# Patient Record
Sex: Male | Born: 1995 | Race: Black or African American | Hispanic: No | Marital: Single | Smoking: Current some day smoker
Health system: Southern US, Community
[De-identification: ages and names within clinical notes are randomized; demographics above are authoritative.]

## PROBLEM LIST (undated history)

## (undated) ENCOUNTER — Emergency Department (HOSPITAL_BASED_OUTPATIENT_CLINIC_OR_DEPARTMENT_OTHER): Payer: Medicaid Other | Source: Home / Self Care

## (undated) DIAGNOSIS — Z8489 Family history of other specified conditions: Secondary | ICD-10-CM

## (undated) DIAGNOSIS — W3400XA Accidental discharge from unspecified firearms or gun, initial encounter: Secondary | ICD-10-CM

## (undated) DIAGNOSIS — A64 Unspecified sexually transmitted disease: Secondary | ICD-10-CM

## (undated) DIAGNOSIS — Z8679 Personal history of other diseases of the circulatory system: Secondary | ICD-10-CM

## (undated) HISTORY — PX: WISDOM TOOTH EXTRACTION: SHX21

## (undated) HISTORY — PX: CARDIAC ELECTROPHYSIOLOGY MAPPING AND ABLATION: SHX1292

---
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
## 2013-02-07 ENCOUNTER — Emergency Department (HOSPITAL_BASED_OUTPATIENT_CLINIC_OR_DEPARTMENT_OTHER)
Admission: EM | Admit: 2013-02-07 | Discharge: 2013-02-08 | Disposition: A | Discharge status: Home / Self Care | Payer: Medicaid Other | Attending: Emergency Medicine | Admitting: Emergency Medicine

## 2013-02-07 ENCOUNTER — Encounter (HOSPITAL_BASED_OUTPATIENT_CLINIC_OR_DEPARTMENT_OTHER): Payer: Self-pay | Admitting: Emergency Medicine

## 2013-02-07 DIAGNOSIS — W2209XA Striking against other stationary object, initial encounter: Secondary | ICD-10-CM | POA: Insufficient documentation

## 2013-02-07 DIAGNOSIS — A549 Gonococcal infection, unspecified: Secondary | ICD-10-CM

## 2013-02-07 DIAGNOSIS — A5400 Gonococcal infection of lower genitourinary tract, unspecified: Secondary | ICD-10-CM | POA: Insufficient documentation

## 2013-02-07 DIAGNOSIS — F17200 Nicotine dependence, unspecified, uncomplicated: Secondary | ICD-10-CM | POA: Insufficient documentation

## 2013-02-07 DIAGNOSIS — S60221A Contusion of right hand, initial encounter: Secondary | ICD-10-CM

## 2013-02-07 DIAGNOSIS — Y9389 Activity, other specified: Secondary | ICD-10-CM | POA: Insufficient documentation

## 2013-02-07 DIAGNOSIS — Y9289 Other specified places as the place of occurrence of the external cause: Secondary | ICD-10-CM | POA: Insufficient documentation

## 2013-02-07 DIAGNOSIS — S60229A Contusion of unspecified hand, initial encounter: Secondary | ICD-10-CM | POA: Insufficient documentation

## 2013-02-07 NOTE — ED Notes (Signed)
Pt reports right hand pain x 4 days after punching wall also here to been for possible STD

## 2013-02-08 ENCOUNTER — Emergency Department (HOSPITAL_BASED_OUTPATIENT_CLINIC_OR_DEPARTMENT_OTHER): Payer: Medicaid Other

## 2013-02-08 LAB — GC/CHLAMYDIA PROBE AMP: GC Probe RNA: POSITIVE — AB

## 2013-02-08 MED ORDER — CEFTRIAXONE SODIUM 250 MG IJ SOLR
250.0000 mg | Freq: Once | INTRAMUSCULAR | Status: AC
  Administered 2013-02-08: 250 mg via INTRAMUSCULAR
  Filled 2013-02-08: qty 250

## 2013-02-08 MED ORDER — LIDOCAINE HCL (PF) 1 % IJ SOLN
INTRAMUSCULAR | Status: AC
  Administered 2013-02-08: 5 mL
  Filled 2013-02-08: qty 5

## 2013-02-08 MED ORDER — AZITHROMYCIN 1 G PO PACK
1.0000 g | PACK | Freq: Once | ORAL | Status: AC
  Administered 2013-02-08: 1 g via ORAL
  Filled 2013-02-08: qty 1

## 2013-02-08 NOTE — ED Provider Notes (Signed)
CSN: [id number]     Arrival date & time 02/07/13  2317 History   First MD Initiated Contact with Patient 25-Feb-2013 0056     Chief Complaint  Patient presents with  . Hand Injury   (Consider location/radiation/quality/duration/timing/severity/associated sxs/prior Treatment) HPI This is a 17 year old male who states he punched a wall 2 days ago. He is having pain and numbness over the right fifth MCP joint. There is no associated deformity or numbness of the finger distally. Pain is worse with movement or palpation. Pain is mild to moderate.  He is also here with a three-day history of green penile discharge and burning with urination. He denies abdominal pain, fever, chills, nausea, vomiting or diarrhea.  History reviewed. No pertinent past medical history. Past Surgical History  Procedure Laterality Date  . 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 No pertinent family history. History  Substance Use Topics  . Smoking status: Current Every Day Smoker -- 1.00 packs/day    Types: Cigarettes  . Smokeless tobacco: Never Used  . Alcohol Use: Yes    Review of Systems  All other systems reviewed and are negative.    Allergies  Review of patient's allergies indicates no known allergies.  Home Medications   Current Outpatient Rx  Name  Route  Sig  Dispense  Refill  . amoxicillin-clavulanate (AUGMENTIN) 875-125 MG per tablet   Oral   Take 1 tablet by mouth 2 (two) times daily. One po bid x 7 days   14 tablet   0    BP 116/77  Pulse 79  Temp(Src) 98.3 F (36.8 C) (Oral)  Resp 18  SpO2 100%  Physical Exam General: Well-developed, well-nourished male in no acute distress; appearance consistent with age of record HENT: normocephalic; atraumatic Eyes: Normal Neck: supple Heart: regular rate and rhythm Lungs: clear to auscultation bilaterally Abdomen: soft; nondistended; nontender; no masses or hepatosplenomegaly GU: Tanner  4 male, circumcised; green urethral discharge Extremities: No deformity; full range of motion; pulses normal; tenderness over the right fifth MCP joint with decreased sensation to light touch, right fifth finger distally neurovascularly intact Neurologic: Awake, alert and oriented; motor function intact in all extremities and symmetric; no facial droop Skin: Warm and dry Psychiatric: Flat affect    ED Course  Procedures (including critical care time)   MDM  Nursing notes and vitals signs, including pulse oximetry, reviewed.  Summary of this visit's results, reviewed by myself:  Imaging Studies: Dg Hand Complete Right  02-25-13   CLINICAL DATA:  Punched wall 4 days ago.  Pain.  EXAM: RIGHT HAND - COMPLETE 3+ VIEW  COMPARISON:  None.  FINDINGS: No acute fracture or dislocation.  No definite soft tissue swelling.  IMPRESSION: No acute osseous abnormality.   Electronically Signed   By: Jeronimo Greaves M.D.   On: Feb 25, 2013 02:37        Hanley Seamen, MD 2013/02/25 [phone]

## 2013-02-08 NOTE — ED Notes (Signed)
No adverse effects noted to IM injection.  

## 2013-02-09 ENCOUNTER — Telehealth (HOSPITAL_COMMUNITY): Payer: Self-pay | Admitting: Emergency Medicine

## 2013-02-09 NOTE — ED Notes (Signed)
+  Gonorrhea. Patient treated with Rocephin and Zithromax. [facility] faxed. 

## 2013-02-09 NOTE — ED Notes (Signed)
Patient has +Gonorrhea. °

## 2013-02-12 NOTE — ED Notes (Signed)
Unable to contact patient via phone. Sent letter. °

## 2013-03-14 ENCOUNTER — Encounter (HOSPITAL_BASED_OUTPATIENT_CLINIC_OR_DEPARTMENT_OTHER): Payer: Self-pay | Admitting: Emergency Medicine

## 2013-03-14 DIAGNOSIS — R05 Cough: Secondary | ICD-10-CM | POA: Insufficient documentation

## 2013-03-14 DIAGNOSIS — R509 Fever, unspecified: Secondary | ICD-10-CM | POA: Insufficient documentation

## 2013-03-14 DIAGNOSIS — R059 Cough, unspecified: Secondary | ICD-10-CM | POA: Insufficient documentation

## 2013-03-14 DIAGNOSIS — Z9889 Other specified postprocedural states: Secondary | ICD-10-CM | POA: Insufficient documentation

## 2013-03-14 DIAGNOSIS — F17200 Nicotine dependence, unspecified, uncomplicated: Secondary | ICD-10-CM | POA: Insufficient documentation

## 2013-03-14 DIAGNOSIS — J029 Acute pharyngitis, unspecified: Secondary | ICD-10-CM | POA: Insufficient documentation

## 2013-03-14 DIAGNOSIS — J3489 Other specified disorders of nose and nasal sinuses: Secondary | ICD-10-CM | POA: Insufficient documentation

## 2013-03-14 DIAGNOSIS — B9789 Other viral agents as the cause of diseases classified elsewhere: Secondary | ICD-10-CM | POA: Insufficient documentation

## 2013-03-14 NOTE — ED Notes (Signed)
Pt reports cough, sore throat, abdominal pain, shortness of breath, that started today. Reports ETOH consumption last night.

## 2013-03-14 NOTE — ED Provider Notes (Signed)
CSN: [id number]     Arrival date & time 03/14/13  2353 History  This chart was scribed for Allen Matar Smitty CordsK Kiira Brach-Rasch, MD by Allen Diaz, ED Scribe. This patient was seen in room MH09/MH09 and the patient's care was started 11:55 PM.    Chief Complaint  Patient presents with  . Cough  . Sore Throat    Patient is a 18 y.o. male presenting with URI. The history is provided by the patient. No language interpreter was used.  URI Presenting symptoms: congestion, cough, fever and sore throat   Presenting symptoms: no ear pain and no fatigue   Congestion:    Location:  Nasal   Interferes with sleep: no   Cough:    Cough characteristics:  Non-productive   Severity:  Mild   Onset quality:  Gradual   Timing:  Sporadic   Progression:  Unchanged   Chronicity:  New Fever:    Timing:  Intermittent   Progression:  Unchanged Sore throat:    Severity:  Moderate   Onset quality:  Gradual   Timing:  Constant   Progression:  Unchanged Severity:  Moderate Onset quality:  Gradual Timing:  Constant Progression:  Unchanged Chronicity:  New Relieved by:  Nothing Worsened by:  Nothing tried Ineffective treatments:  None tried Associated symptoms: no arthralgias and no neck pain   Risk factors: not elderly     HPI Comments: Allen Diaz is a 18 y.o. male who presents to the Emergency Department complaining of 1 day of gradual onset, gradually worsening, constant sore throat with associated fever, cough, congestion, and chest tightness. He has been taking ibuprofen with mild relief. He denies any other symptoms.   History reviewed. No pertinent past medical history. Past Surgical History  Procedure Laterality Date  . Cardiac valve surgery      as a child    History reviewed. No pertinent family history. History  Substance Use Topics  . Smoking status: Current Some Day Smoker -- 1.00 packs/day    Types: Cigarettes  . Smokeless tobacco: Never Used  . Alcohol Use: Yes     Comment: ocassion     Review of Systems  Constitutional: Positive for fever. Negative for fatigue.  HENT: Positive for congestion and sore throat. Negative for drooling, ear pain, trouble swallowing and voice change.   Respiratory: Positive for cough.   Cardiovascular: Negative for chest pain.  Musculoskeletal: Negative for arthralgias and neck pain.  All other systems reviewed and are negative.    Allergies  Review of patient's allergies indicates no known allergies.  Home Medications   Current Outpatient Rx  Name  Route  Sig  Dispense  Refill  . oxyCODONE-acetaminophen (PERCOCET/ROXICET) 5-325 MG per tablet   Oral   Take 1 tablet by mouth every 4 (four) hours as needed for pain.   15 tablet   0    BP 105/59  Pulse 112  Temp(Src) 101.6 F (38.7 C) (Oral)  Resp 20  Ht 5\' 11"  (1.803 m)  Wt 145 lb (65.772 kg)  BMI 20.23 kg/m2  SpO2 100% Physical Exam  Nursing note and vitals reviewed. Constitutional: He is oriented to person, place, and time. He appears well-developed and well-nourished. No distress.  HENT:  Head: Normocephalic and atraumatic.  Right Ear: External ear normal.  Left Ear: External ear normal.  Mouth/Throat: Uvula is midline, oropharynx is clear and moist and mucous membranes are normal. Mucous membranes are not dry. No oropharyngeal exudate, posterior oropharyngeal edema, posterior oropharyngeal erythema or tonsillar  abscesses.  Eyes: Pupils are equal, round, and reactive to light.  Neck: Normal range of motion. Neck supple.  Cardiovascular: Normal rate, regular rhythm, normal heart sounds and intact distal pulses.   Pulmonary/Chest: Effort normal and breath sounds normal. No respiratory distress. He has no wheezes. He has no rales. He exhibits no tenderness.  Lungs are clear to auscultation, anteriorly and posteriorly.   Abdominal: Soft. Bowel sounds are normal. He exhibits no distension. There is no tenderness. There is no rebound.  Lymphadenopathy:    He has no  cervical adenopathy.  Neurological: He is alert and oriented to person, place, and time.  Skin: Skin is warm and dry.  Psychiatric: He has a normal mood and affect.    ED Course  Procedures (including critical care time)  DIAGNOSTIC STUDIES: Oxygen Saturation is 100% on RA, normal by my interpretation.    COORDINATION OF CARE: 12:13 AM Discussed treatment plan with pt at bedside and pt agreed to plan.   Labs Review Labs Reviewed  RAPID STREP SCREEN   Imaging Review No results found.  EKG Interpretation   None       MDM  No diagnosis found. Tylenol alternating with ibuprofen, fluids and rest   I personally performed the services described in this documentation, which was scribed in my presence. The recorded information has been reviewed and is accurate.   Allen Awe, MD 03/15/13 [phone]

## 2013-03-15 ENCOUNTER — Emergency Department (HOSPITAL_BASED_OUTPATIENT_CLINIC_OR_DEPARTMENT_OTHER)
Admission: EM | Admit: 2013-03-15 | Discharge: 2013-03-15 | Disposition: A | Discharge status: Home / Self Care | Payer: Medicaid Other | Attending: Emergency Medicine | Admitting: Emergency Medicine

## 2013-03-15 DIAGNOSIS — B349 Viral infection, unspecified: Secondary | ICD-10-CM

## 2013-03-15 LAB — RAPID STREP SCREEN (MED CTR MEBANE ONLY): Streptococcus, Group A Screen (Direct): NEGATIVE

## 2013-03-15 MED ORDER — IBUPROFEN 600 MG PO TABS: 600.0000 mg | ORAL_TABLET | Freq: Four times a day (QID) | ORAL | Status: DC | PRN

## 2013-03-15 MED ORDER — IBUPROFEN 800 MG PO TABS
800.0000 mg | ORAL_TABLET | Freq: Once | ORAL | Status: AC
  Administered 2013-03-15: 800 mg via ORAL
  Filled 2013-03-15: qty 1

## 2013-03-17 LAB — CULTURE, GROUP A STREP

## 2013-08-31 HISTORY — PX: IM NAILING TIBIA: SUR734

## 2013-12-23 ENCOUNTER — Encounter (HOSPITAL_BASED_OUTPATIENT_CLINIC_OR_DEPARTMENT_OTHER): Payer: Self-pay | Admitting: Emergency Medicine

## 2013-12-23 ENCOUNTER — Emergency Department (HOSPITAL_BASED_OUTPATIENT_CLINIC_OR_DEPARTMENT_OTHER)
Admission: EM | Admit: 2013-12-23 | Discharge: 2013-12-23 | Disposition: A | Discharge status: Home / Self Care | Payer: Medicaid Other | Attending: Emergency Medicine | Admitting: Emergency Medicine

## 2013-12-23 DIAGNOSIS — Z720 Tobacco use: Secondary | ICD-10-CM | POA: Diagnosis not present

## 2013-12-23 DIAGNOSIS — R300 Dysuria: Secondary | ICD-10-CM | POA: Diagnosis present

## 2013-12-23 DIAGNOSIS — Z792 Long term (current) use of antibiotics: Secondary | ICD-10-CM | POA: Insufficient documentation

## 2013-12-23 DIAGNOSIS — Z9889 Other specified postprocedural states: Secondary | ICD-10-CM | POA: Insufficient documentation

## 2013-12-23 DIAGNOSIS — N342 Other urethritis: Secondary | ICD-10-CM | POA: Insufficient documentation

## 2013-12-23 LAB — URINALYSIS, ROUTINE W REFLEX MICROSCOPIC
BILIRUBIN URINE: NEGATIVE
Glucose, UA: NEGATIVE mg/dL
HGB URINE DIPSTICK: NEGATIVE
KETONES UR: NEGATIVE mg/dL
Nitrite: NEGATIVE
Protein, ur: NEGATIVE mg/dL
SPECIFIC GRAVITY, URINE: 1.008 (ref 1.005–1.030)
UROBILINOGEN UA: 0.2 mg/dL (ref 0.0–1.0)
pH: 7.5 (ref 5.0–8.0)

## 2013-12-23 LAB — URINE MICROSCOPIC-ADD ON

## 2013-12-23 MED ORDER — CEFTRIAXONE SODIUM 250 MG IJ SOLR
250.0000 mg | Freq: Once | INTRAMUSCULAR | Status: AC
  Administered 2013-12-23: 250 mg via INTRAMUSCULAR
  Filled 2013-12-23: qty 250

## 2013-12-23 MED ORDER — AZITHROMYCIN 250 MG PO TABS
2000.0000 mg | ORAL_TABLET | Freq: Once | ORAL | Status: AC
  Administered 2013-12-23: 2000 mg via ORAL
  Filled 2013-12-23: qty 8

## 2013-12-23 NOTE — ED Notes (Signed)
C/o dysuria x last week-penile d/c x 2 days

## 2013-12-23 NOTE — ED Notes (Signed)
C/o burning w urination and brown penile discharge x 2 days

## 2013-12-23 NOTE — Discharge Instructions (Signed)
Urethritis °Urethritis is an inflammation of the tube through which urine exits your bladder (urethra).  °CAUSES °Urethritis is often caused by an infection in your urethra. The infection can be viral, like herpes. The infection can also be bacterial, like gonorrhea. °RISK FACTORS °Risk factors of urethritis include: °· Having sex without using a condom. °· Having multiple sexual partners. °· Having poor hygiene. °SIGNS AND SYMPTOMS °Symptoms of urethritis are less noticeable in women than in men. These symptoms include: °· Burning feeling when you urinate (dysuria). °· Discharge from your urethra. °· Blood in your urine (hematuria). °· Urinating more than usual. °DIAGNOSIS  °To confirm a diagnosis of urethritis, your health care provider will do the following: °· Ask about your sexual history. °· Perform a physical exam. °· Have you provide a sample of your urine for lab testing. °· Use a cotton swab to gently collect a sample from your urethra for lab testing. °TREATMENT  °It is important to treat urethritis. Depending on the cause, untreated urethritis may lead to serious genital infections and possibly infertility. Urethritis caused by a bacterial infection is treated with antibiotic medicine. All sexual partners must be treated.  °HOME CARE INSTRUCTIONS °· Do not have sex until the test results are known and treatment is completed, even if your symptoms go away before you finish treatment. °· If you were prescribed an antibiotic, finish it all even if you start to feel better. °SEEK MEDICAL CARE IF:  °· Your symptoms are not improved in 3 days. °· Your symptoms are getting worse. °· You develop abdominal pain or pelvic pain (in women). °· You develop joint pain. °· You have a fever. °SEEK IMMEDIATE MEDICAL CARE IF:  °· You have severe pain in the belly, back, or side. °· You have repeated vomiting. °MAKE SURE YOU: °· Understand these instructions. °· Will watch your condition. °· Will get help right away if you  are not doing well or get worse. °Document Released: 08/21/2000 Document Revised: 07/12/2013 Document Reviewed: 10/26/2012 °[employer]® Patient Information ©2015 [employer]. This information is not intended to replace advice given to you by your health care provider. Make sure you discuss any questions you have with your health care provider. ° °

## 2013-12-23 NOTE — ED Provider Notes (Signed)
CSN: [id number]     Arrival date & time 12/23/13  1916 History  This chart was scribed for Allen Diaz L Khaleb Broz, MD by Bronson CurbJacqueline Melvin, ED Scribe. This patient was seen in room MH07/MH07 and the patient's care was started at 9:18 PM.      Chief Complaint  Patient presents with  . Dysuria    The history is provided by the patient. No language interpreter was used.    HPI Comments: Allen Diaz is a 18 y.o. male who presents to the Emergency Department complaining of dysuria that has been ongoing for the past week. There is associated brown penile discharge for the past 2 days. Patient has history of prior episodes in which he was diagnosed with gonorrhea. Since this diagnosis, he reports he has only had sexual intercourse, that is sometimes unprotected, with his girlfriend. He states that his girlfriend has not exhibited symptoms of an STD, nor has she been diagnosed with one recently. he denies fever or any other symptoms at this time. Patient has no history of significant health conditions.   History reviewed. No pertinent past medical history. Past Surgical History  Procedure Laterality Date  . 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 Leg surgery    . Hip surgery     No family history on file. History  Substance Use Topics  . Smoking status: Current Every Day Smoker -- 1.00 packs/day    Types: Cigarettes  . Smokeless tobacco: Never Used  . Alcohol Use: Yes    Review of Systems  Genitourinary: Positive for dysuria and discharge.  All other systems reviewed and are negative.     Allergies  Review of patient's allergies indicates no known allergies.  Home Medications   Prior to Admission medications   Medication Sig Start Date End Date Taking? Authorizing Provider  amoxicillin-clavulanate (AUGMENTIN) 875-125 MG per tablet Take 1 tablet by mouth 2 (two) times daily. One po bid x 7 days 08/08/12   Nada Boozerobyn M Hess, PA-C   BP 133/82  Pulse  72  Temp(Src) 98.2 F (36.8 C) (Oral)  Resp 16  Ht 5\' 11"  (1.803 m)  Wt 144 lb (65.318 kg)  BMI 20.09 kg/m2  SpO2 99% Physical Exam  Nursing note and vitals reviewed. Constitutional: He is oriented to person, place, and time. He appears well-developed and well-nourished. No distress.  HENT:  Head: Normocephalic and atraumatic.  Eyes: Pupils are equal, round, and reactive to light.  Neck: Normal range of motion.  Cardiovascular: Normal rate and intact distal pulses.   Pulmonary/Chest: No respiratory distress.  Abdominal: Normal appearance. He exhibits no distension.  Genitourinary: Testes normal. No penile tenderness. Discharge found.  Musculoskeletal: Normal range of motion.  Neurological: He is alert and oriented to person, place, and time. No cranial nerve deficit.  Skin: Skin is warm and dry. No rash noted.  Psychiatric: He has a normal mood and affect. His behavior is normal.    ED Course  Procedures (including critical care time)   Medications  cefTRIAXone (ROCEPHIN) injection 250 mg (250 mg Intramuscular Given 12/23/13 2136)  azithromycin (ZITHROMAX) tablet 2,000 mg (2,000 mg Oral Given 12/23/13 2136)    COORDINATION OF CARE: At 2126 Discussed treatment plan with patient which includes Rocephin. Patient agrees.  Labs Review Labs Reviewed  URINALYSIS, ROUTINE W REFLEX MICROSCOPIC - Abnormal; Notable for the following:    Leukocytes, UA MODERATE (*)    All other components  within normal limits  GC/CHLAMYDIA PROBE AMP  URINE MICROSCOPIC-ADD ON    Imaging Review No results found.    MDM   Final diagnoses:  Urethritis    I personally performed the services described in this documentation, which was scribed in my presence. The recorded information has been reviewed and considered.   Allen Diaz L [name], MD 12/30/13 2149

## 2013-12-24 LAB — GC/CHLAMYDIA PROBE AMP
CT Probe RNA: NEGATIVE
GC PROBE AMP APTIMA: NEGATIVE
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
## 2015-12-29 ENCOUNTER — Encounter (HOSPITAL_BASED_OUTPATIENT_CLINIC_OR_DEPARTMENT_OTHER): Payer: Self-pay | Admitting: Emergency Medicine

## 2017-01-20 ENCOUNTER — Encounter (HOSPITAL_BASED_OUTPATIENT_CLINIC_OR_DEPARTMENT_OTHER): Payer: Self-pay | Admitting: *Deleted

## 2017-01-20 ENCOUNTER — Emergency Department (HOSPITAL_BASED_OUTPATIENT_CLINIC_OR_DEPARTMENT_OTHER)
Admission: EM | Admit: 2017-01-20 | Discharge: 2017-01-20 | Disposition: A | Discharge status: Home / Self Care | Payer: Self-pay | Attending: Emergency Medicine | Admitting: Emergency Medicine

## 2017-01-20 DIAGNOSIS — A6002 Herpesviral infection of other male genital organs: Secondary | ICD-10-CM | POA: Insufficient documentation

## 2017-01-20 DIAGNOSIS — F17210 Nicotine dependence, cigarettes, uncomplicated: Secondary | ICD-10-CM | POA: Insufficient documentation

## 2017-01-20 MED ORDER — VALACYCLOVIR HCL 500 MG PO TABS
1000.0000 mg | ORAL_TABLET | Freq: Once | ORAL | Status: AC
Start: 2017-01-20 — End: 2017-01-20
  Administered 2017-01-20: 1000 mg via ORAL
  Filled 2017-01-20: qty 2

## 2017-01-20 MED ORDER — CEFTRIAXONE SODIUM 250 MG IJ SOLR
250.0000 mg | Freq: Once | INTRAMUSCULAR | Status: AC
  Administered 2017-01-20: 250 mg via INTRAMUSCULAR
  Filled 2017-01-20: qty 250

## 2017-01-20 MED ORDER — VALACYCLOVIR HCL 1 G PO TABS: 1000.0000 mg | ORAL_TABLET | Freq: Two times a day (BID) | ORAL | 0 refills | Status: AC

## 2017-01-20 MED ORDER — AZITHROMYCIN 250 MG PO TABS
1000.0000 mg | ORAL_TABLET | Freq: Once | ORAL | Status: AC
  Administered 2017-01-20: 1000 mg via ORAL
  Filled 2017-01-20: qty 4

## 2017-01-20 NOTE — Discharge Instructions (Signed)
You were seen today with concerns for STDs.  Your exam is consistent with genital herpes.  This is a virus that cannot be cured but can be treated.  Take Valtrex for 10 days.  Abstain from sexual activity until lesions are no longer present.  Always use protection.  Inform partners of diagnosis.

## 2017-01-20 NOTE — ED Triage Notes (Addendum)
Pt c/o "bumps" to groin area and also to the posterior aspect of penis for past 3 days.  Denies any new sexual partners. Denies any penile discharge. Denies any urinary symptoms. States area is painful. Denies any fevers. Denies any std history

## 2017-01-20 NOTE — ED Provider Notes (Signed)
[facility] EMERGENCY DEPARTMENT Provider Note   CSN: [id number] Arrival date & time: 01/20/17  0342     History   Chief Complaint Chief Complaint  Patient presents with  . STD check    HPI Allen Diaz is a 21 y.o. male.  HPI  This is a 21 year old male who presents with concerns for STDs.  Patient reports 2-3-day history of noticing "bumps on my penis."  States that at times they are painful.  He denies any new sexual partners.  However, he reports inconsistent condom use.  Partner without any known symptoms.  He denies any dysuria or penile discharge.  He does have a history of STDs.  Denies any fevers or systemic symptoms.  History reviewed. No pertinent past medical history.  There are no active problems to display for this patient.   Past Surgical History:  Procedure Laterality Date  . 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 CARDIAC VALVE SURGERY     as a child   . HIP SURGERY    . LEG SURGERY         Home Medications    Prior to Admission medications   Medication Sig Start Date End Date Taking? Authorizing Provider  amoxicillin-clavulanate (AUGMENTIN) 875-125 MG per tablet Take 1 tablet by mouth 2 (two) times daily. One po bid x 7 days 08/08/12   Hess, Nada Boozerobyn M, PA-C  ibuprofen (ADVIL,MOTRIN) 600 MG tablet Take 1 tablet (600 mg total) by mouth every 6 (six) hours as needed. 03/15/13   Palumbo, April, MD  oxyCODONE-acetaminophen (PERCOCET/ROXICET) 5-325 MG per tablet Take 1 tablet by mouth every 4 (four) hours as needed for pain. 11/11/12   Pricilla LovelessGoldston, Scott, MD  valACYclovir (VALTREX) 1000 MG tablet Take 1 tablet (1,000 mg total) 2 (two) times daily for 10 days by mouth. 01/20/17 01/30/17  Jamisen Duerson, Mayer Maskerourtney F, MD    Family History No family history on file.  Social History Social History   Tobacco Use  . Smoking status: Current Every Day Smoker    Packs/day: 1.00    Types: Cigarettes  . Smokeless tobacco:  Never Used  Substance Use Topics  . Alcohol use: Yes    Comment: ocassion  . Drug use: No     Allergies   Patient has no known allergies.   Review of Systems Review of Systems  Constitutional: Negative for fever.  Genitourinary: Negative for discharge, penile pain, penile swelling and testicular pain.       Penile lesions  All other systems reviewed and are negative.    Physical Exam Updated Vital Signs BP 127/74 (BP Location: Left Arm)   Pulse 65   Temp 98 F (36.7 C) (Oral)   Resp 16   SpO2 99%   Physical Exam  Constitutional: He is oriented to person, place, and time. He appears well-developed and well-nourished.  HENT:  Head: Normocephalic and atraumatic.  Cardiovascular: Normal rate and regular rhythm.  Pulmonary/Chest: Effort normal. No respiratory distress.  Genitourinary:  Genitourinary Comments: Circumcised penis, multiple vesicular lesions noted at the base of the penis with 1-2 ulcerations less than 5 mm in size, reactive bilateral lymphadenopathy noted  Neurological: He is alert and oriented to person, place, and time.  Skin: Skin is warm and dry.  Psychiatric: He has a normal mood and affect.  Nursing note and vitals reviewed.    ED Treatments / Results  Labs (all labs ordered are listed, but  only abnormal results are displayed) Labs Reviewed  GC/CHLAMYDIA PROBE AMP ([place]    EKG  EKG Interpretation None       Radiology No results found.  Procedures Procedures (including critical care time)  Medications Ordered in ED Medications  azithromycin (ZITHROMAX) tablet 1,000 mg (not administered)  cefTRIAXone (ROCEPHIN) injection 250 mg (not administered)  valACYclovir (VALTREX) tablet 1,000 mg (not administered)     Initial Impression / Assessment and Plan / ED Course  I have reviewed the triage vital signs and the nursing notes.  Pertinent labs & imaging results that were available during my care of the patient were  reviewed by me and considered in my medical decision making (see chart for details).     Patient presents with concerns for STDs.  Clinical exam is suspicious for genital herpes.  Patient was tested and treated for other STDs as well.  Have referred patient to the health department for definitive testing.  Valtrex prescribed for presumed primary HSV outbreak.  Recommended safe sex practices and abstaining from sexual activity until lesions are clear.  Patient stated understanding.  After history, exam, and medical workup I feel the patient has been appropriately medically screened and is safe for discharge home. Pertinent diagnoses were discussed with the patient. Patient was given return precautions.   Final Clinical Impressions(s) / ED Diagnoses   Final diagnoses:  Herpes genitalis in men    ED Discharge Orders        Ordered    valACYclovir (VALTREX) 1000 MG tablet  2 times daily     01/20/17 0409       Lucifer Soja, Mayer Maskerourtney F, MD 01/20/17 [phone]

## 2017-01-20 NOTE — ED Notes (Signed)
Extensive STD education provided at d/c. Voiced understanding

## 2017-01-21 LAB — GC/CHLAMYDIA PROBE AMP (~~LOC~~) NOT AT ARMC
Chlamydia: POSITIVE — AB
NEISSERIA GONORRHEA: NEGATIVE

## 2017-01-25 ENCOUNTER — Emergency Department (HOSPITAL_BASED_OUTPATIENT_CLINIC_OR_DEPARTMENT_OTHER): Payer: Self-pay

## 2017-01-25 ENCOUNTER — Other Ambulatory Visit: Payer: Self-pay

## 2017-01-25 ENCOUNTER — Emergency Department (HOSPITAL_BASED_OUTPATIENT_CLINIC_OR_DEPARTMENT_OTHER)
Admission: EM | Admit: 2017-01-25 | Discharge: 2017-01-25 | Disposition: A | Discharge status: Home / Self Care | Payer: Self-pay | Attending: Emergency Medicine | Admitting: Emergency Medicine

## 2017-01-25 ENCOUNTER — Encounter (HOSPITAL_BASED_OUTPATIENT_CLINIC_OR_DEPARTMENT_OTHER): Payer: Self-pay | Admitting: *Deleted

## 2017-01-25 DIAGNOSIS — Z5321 Procedure and treatment not carried out due to patient leaving prior to being seen by health care provider: Secondary | ICD-10-CM | POA: Insufficient documentation

## 2017-01-25 NOTE — ED Triage Notes (Signed)
Call x 3, no answer.

## 2017-01-25 NOTE — ED Triage Notes (Signed)
Called x 2. Pt not located in either lobby. Did not inform staff before leaving 

## 2017-01-25 NOTE — ED Triage Notes (Signed)
Called for vital sign recheck, no answer 
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
## 2017-01-30 ENCOUNTER — Other Ambulatory Visit: Payer: Self-pay

## 2017-01-30 ENCOUNTER — Emergency Department (HOSPITAL_BASED_OUTPATIENT_CLINIC_OR_DEPARTMENT_OTHER)
Admission: EM | Admit: 2017-01-30 | Discharge: 2017-01-30 | Disposition: A | Discharge status: Home / Self Care | Payer: Self-pay | Attending: Physician Assistant | Admitting: Physician Assistant

## 2017-01-30 ENCOUNTER — Encounter (HOSPITAL_BASED_OUTPATIENT_CLINIC_OR_DEPARTMENT_OTHER): Payer: Self-pay

## 2017-01-30 DIAGNOSIS — F17210 Nicotine dependence, cigarettes, uncomplicated: Secondary | ICD-10-CM | POA: Insufficient documentation

## 2017-01-30 DIAGNOSIS — Z23 Encounter for immunization: Secondary | ICD-10-CM | POA: Insufficient documentation

## 2017-01-30 DIAGNOSIS — M25562 Pain in left knee: Secondary | ICD-10-CM | POA: Insufficient documentation

## 2017-01-30 DIAGNOSIS — R590 Localized enlarged lymph nodes: Secondary | ICD-10-CM | POA: Insufficient documentation

## 2017-01-30 DIAGNOSIS — Z760 Encounter for issue of repeat prescription: Secondary | ICD-10-CM | POA: Insufficient documentation

## 2017-01-30 HISTORY — DX: Unspecified sexually transmitted disease: A64

## 2017-01-30 MED ORDER — ACYCLOVIR 400 MG PO TABS
400.0000 mg | ORAL_TABLET | Freq: Four times a day (QID) | ORAL | 0 refills | Status: DC
Start: 2017-01-30 — End: 2017-03-19

## 2017-01-30 MED ORDER — TETANUS-DIPHTH-ACELL PERTUSSIS 5-2.5-18.5 LF-MCG/0.5 IM SUSP
0.5000 mL | Freq: Once | INTRAMUSCULAR | Status: AC
  Administered 2017-01-30: 0.5 mL via INTRAMUSCULAR
  Filled 2017-01-30: qty 0.5

## 2017-01-30 NOTE — ED Notes (Signed)
ED Provider at bedside. 
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
## 2017-02-06 ENCOUNTER — Encounter (HOSPITAL_BASED_OUTPATIENT_CLINIC_OR_DEPARTMENT_OTHER): Payer: Self-pay

## 2017-02-06 ENCOUNTER — Emergency Department (HOSPITAL_BASED_OUTPATIENT_CLINIC_OR_DEPARTMENT_OTHER)
Admission: EM | Admit: 2017-02-06 | Discharge: 2017-02-06 | Disposition: A | Discharge status: Home / Self Care | Payer: Self-pay | Attending: Emergency Medicine | Admitting: Emergency Medicine

## 2017-02-06 ENCOUNTER — Other Ambulatory Visit: Payer: Self-pay

## 2017-02-06 DIAGNOSIS — J020 Streptococcal pharyngitis: Secondary | ICD-10-CM | POA: Insufficient documentation

## 2017-02-06 DIAGNOSIS — F17210 Nicotine dependence, cigarettes, uncomplicated: Secondary | ICD-10-CM | POA: Insufficient documentation

## 2017-02-06 LAB — RAPID STREP SCREEN (MED CTR MEBANE ONLY): STREPTOCOCCUS, GROUP A SCREEN (DIRECT): POSITIVE — AB

## 2017-02-06 MED ORDER — IBUPROFEN 800 MG PO TABS
800.0000 mg | ORAL_TABLET | Freq: Once | ORAL | Status: AC
  Administered 2017-02-06: 800 mg via ORAL
  Filled 2017-02-06: qty 1

## 2017-02-06 MED ORDER — PENICILLIN G BENZATHINE 1200000 UNIT/2ML IM SUSP
1.2000 10*6.[IU] | Freq: Once | INTRAMUSCULAR | Status: AC
  Administered 2017-02-06: 1.2 10*6.[IU] via INTRAMUSCULAR
  Filled 2017-02-06: qty 2

## 2017-02-06 NOTE — ED Notes (Signed)
Had visitor step out and counseled pt on his multiple treatments for STDs and the importance getting further testing at the health department.  He is smiling and does not seem to grasp that it is important for him to get further testing and use condoms every time that he decides to engage in intercourse since he can still spread the herpes virus without an open sore.

## 2017-02-06 NOTE — ED Triage Notes (Signed)
Pt states he has had a sore throat for three days, no fever, has not taken anything, is on phone face-timing with someone when nurse walks into room

## 2017-02-06 NOTE — ED Notes (Signed)
Pt verbalizes understanding of d/c instructions and denies any further needs at this time. 

## 2017-02-06 NOTE — ED Notes (Addendum)
During visit on 11/22, pt requested another prescription for acyclovir as he did not get it filled from the 11/12 visit. Incidentally, pt still has filled the acyclovir and states that he did not take it.

## 2017-02-06 NOTE — ED Provider Notes (Addendum)
TIME SEEN: 3:23 AM  CHIEF COMPLAINT: Sore throat  HPI: Patient is a 21 year old male who presents to the emergency department with complaints of 3 days of sore throat.  No fevers, cough, vomiting or diarrhea.  No sick contacts.  ROS: See HPI Constitutional: no fever  Eyes: no drainage  ENT: no runny nose   Cardiovascular:  no chest pain  Resp: no SOB  GI: no vomiting GU: no dysuria Integumentary: no rash  Allergy: no hives  Musculoskeletal: no leg swelling  Neurological: no slurred speech ROS otherwise negative  PAST MEDICAL HISTORY/PAST SURGICAL HISTORY:  Past Medical History:  Diagnosis Date  . Chlamydia   . Genital herpes   . Gonorrhea   . STD (male)     MEDICATIONS:  Prior to Admission medications   Medication Sig Start Date End Date Taking? Authorizing Provider  acyclovir (ZOVIRAX) 400 MG tablet Take 1 tablet (400 mg total) by mouth 4 (four) times daily. 01/30/17   Mackuen, Cindee Saltourteney Lyn, MD    ALLERGIES:  No Known Allergies  SOCIAL HISTORY:  Social History   Tobacco Use  . Smoking status: Current Every Day Smoker    Packs/day: 1.00    Types: Cigarettes  . Smokeless tobacco: Never Used  Substance Use Topics  . Alcohol use: Yes    Comment: occ    FAMILY HISTORY: No family history on file.  EXAM: BP 129/75 (BP Location: Right Arm)   Pulse 78   Temp 98.9 F (37.2 C) (Oral)   Resp 16   Ht 5\' 11"  (1.803 m)   Wt 67.3 kg (148 lb 5.9 oz)   SpO2 100%   BMI 20.69 kg/m  CONSTITUTIONAL: Alert and oriented and responds appropriately to questions. Well-appearing; well-nourished, smiling and laughing, afebrile and nontoxic HEAD: Normocephalic EYES: Conjunctivae clear, pupils appear equal, EOMI ENT: normal nose; moist mucous membranes; patient has posterior pharyngeal erythema with bilateral tonsillar hypertrophy without significant exudate, no uvular deviation, no unilateral swelling, no trismus or drooling, no muffled voice, normal phonation, no stridor, no  dental caries present, no drainable dental abscess noted, no Ludwig's angina, tongue sits flat in the bottom of the mouth, no angioedema, no facial erythema or warmth, no facial swelling; no pain with movement of the neck.  No herpetic lesions seen in the mouth or oropharynx. NECK: Supple, no meningismus, no nuchal rigidity, no LAD  CARD: RRR; S1 and S2 appreciated; no murmurs, no clicks, no rubs, no gallops RESP: Normal chest excursion without splinting or tachypnea; breath sounds clear and equal bilaterally; no wheezes, no rhonchi, no rales, no hypoxia or respiratory distress, speaking full sentences ABD/GI: Normal bowel sounds; non-distended; soft, non-tender, no rebound, no guarding, no peritoneal signs, no hepatosplenomegaly BACK:  The back appears normal and is non-tender to palpation, there is no CVA tenderness EXT: Normal ROM in all joints; non-tender to palpation; no edema; normal capillary refill; no cyanosis, no calf tenderness or swelling    SKIN: Normal color for age and race; warm; no rash NEURO: Moves all extremities equally PSYCH: The patient's mood and manner are appropriate. Grooming and personal hygiene are appropriate.  MEDICAL DECISION MAKING: Patient here with strep pharyngitis.  He has opted for IM penicillin injection in the ED.  Recommended alternating Tylenol and Motrin for pain at home, warm salt water gargles, over-the-counter Chloraseptic spray and lozenges for pain control.  No sign of peritonsillar abscess, deep space neck infection, pneumonia, meningitis based on exam.  He is in no distress.  Smiling and  laughing.  Afebrile and nontoxic-appearing.  At this time, I do not feel there is any life-threatening condition present. I have reviewed and discussed all results (EKG, imaging, lab, urine as appropriate) and exam findings with patient/family. I have reviewed nursing notes and appropriate previous records.  I feel the patient is safe to be discharged home without further  emergent workup and can continue workup as an outpatient as needed. Discussed usual and customary return precautions. Patient/family verbalize understanding and are comfortable with this plan.  Outpatient follow-up has been provided if needed. All questions have been answered.      Eola Waldrep, Layla MawKristen N, DO 02/06/17 0325    Autumm Hattery, Layla MawKristen N, DO 02/06/17 [phone]

## 2017-02-06 NOTE — ED Notes (Signed)
ED Provider at bedside. 

## 2017-02-06 NOTE — Discharge Instructions (Signed)
You may alternate Tylenol 1000 mg every 6 hours as needed for pain and Ibuprofen 800 mg every 8 hours as needed for pain.  Please take Ibuprofen with food. ° ° ° °To find a primary care or specialty doctor please call [phone] or [phone] to access "[facility] Find a Doctor Service." ° °You may also go on the [facility] website at [email or web address] ° °There are also multiple [facility], [facility], [facility] and [facility] practices throughout the [place] that are frequently accepting new patients. You may find a clinic that is close to your home and contact them. ° °[facility] -  °[address] °[place] [address] °[phone] ° ° °[facility] -  °[address] °[place] [address] °[phone] ° ° °[facility] - °[address]  °[place] [address] °[phone] ° ° °

## 2017-03-19 ENCOUNTER — Other Ambulatory Visit: Payer: Self-pay

## 2017-03-19 ENCOUNTER — Encounter (HOSPITAL_BASED_OUTPATIENT_CLINIC_OR_DEPARTMENT_OTHER): Payer: Self-pay | Admitting: Emergency Medicine

## 2017-03-19 ENCOUNTER — Emergency Department (HOSPITAL_BASED_OUTPATIENT_CLINIC_OR_DEPARTMENT_OTHER)
Admission: EM | Admit: 2017-03-19 | Discharge: 2017-03-19 | Disposition: A | Discharge status: Home / Self Care | Payer: Self-pay | Attending: Emergency Medicine | Admitting: Emergency Medicine

## 2017-03-19 DIAGNOSIS — F17210 Nicotine dependence, cigarettes, uncomplicated: Secondary | ICD-10-CM | POA: Insufficient documentation

## 2017-03-19 DIAGNOSIS — J069 Acute upper respiratory infection, unspecified: Secondary | ICD-10-CM | POA: Insufficient documentation

## 2017-03-19 DIAGNOSIS — Z79899 Other long term (current) drug therapy: Secondary | ICD-10-CM | POA: Insufficient documentation

## 2017-03-19 DIAGNOSIS — B9789 Other viral agents as the cause of diseases classified elsewhere: Secondary | ICD-10-CM | POA: Insufficient documentation

## 2017-03-19 LAB — RAPID STREP SCREEN (MED CTR MEBANE ONLY): STREPTOCOCCUS, GROUP A SCREEN (DIRECT): NEGATIVE

## 2017-03-19 MED ORDER — DEXAMETHASONE 6 MG PO TABS
10.0000 mg | ORAL_TABLET | Freq: Once | ORAL | Status: AC
  Administered 2017-03-19: 10 mg via ORAL
  Filled 2017-03-19: qty 1

## 2017-03-19 NOTE — Discharge Instructions (Signed)
Drink plenty of fluids. Take acetaminophen or ibuprofen as needed for pain or fever. Stop smoking.  Once you are over this infection, please get the influenza vaccination (flu shot).

## 2017-03-19 NOTE — ED Provider Notes (Signed)
[facility] EMERGENCY DEPARTMENT Provider Note   CSN: [id number] Arrival date & time: 03/19/17  0428     History   Chief Complaint No chief complaint on file.   HPI Gilmore Gibson-Harris is a 22 y.o. male.  The history is provided by the patient.  He complains of a 3-4-day history of sore throat, nasal congestion, nonproductive cough.  He denies fever, chills, sweats.  He has had some mild arthralgias.  He denies any sick contacts.  He has not done anything to treat his symptoms.  He has not had the influenza immunization this season.  He is a cigarette smoker.  Past Medical History:  Diagnosis Date  . Chlamydia   . Genital herpes   . Gonorrhea   . STD (male)     There are no active problems to display for this patient.   Past Surgical History:  Procedure Laterality Date  . 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 CARDIAC VALVE SURGERY     as a child   . HIP SURGERY    . LEG SURGERY         Home Medications    Prior to Admission medications   Medication Sig Start Date End Date Taking? Authorizing Provider  acyclovir (ZOVIRAX) 400 MG tablet Take 1 tablet (400 mg total) by mouth 4 (four) times daily. 01/30/17   Mackuen, Cindee Salt, MD    Family History No family history on file.  Social History Social History   Tobacco Use  . Smoking status: Current Every Day Smoker    Packs/day: 1.00    Types: Cigarettes  . Smokeless tobacco: Never Used  Substance Use Topics  . Alcohol use: Yes    Comment: occ  . Drug use: No     Allergies   Patient has no known allergies.   Review of Systems Review of Systems  All other systems reviewed and are negative.    Physical Exam Updated Vital Signs BP 128/86 (BP Location: Left Arm)   Pulse 80   Temp 99 F (37.2 C) (Oral)   Resp 18   Ht 5\' 11"  (1.803 m)   Wt 67.6 kg (149 lb 1.6 oz)   SpO2 100%   BMI 20.80 kg/m   Physical Exam  Nursing note and vitals  reviewed.  22 year old male, resting comfortably and in no acute distress. Vital signs are normal. Oxygen saturation is 100%, which is normal. Head is normocephalic and atraumatic. PERRLA, EOMI. Oropharynx shows moderate tonsillar erythema and hypertrophy with faint exudate present bilaterally.  No pooling of secretions, no stridor, normal phonation. Neck is nontender and supple without adenopathy or JVD. Back is nontender and there is no CVA tenderness. Lungs are clear without rales, wheezes, or rhonchi. Chest is nontender. Heart has regular rate and rhythm without murmur. Abdomen is soft, flat, nontender without masses or hepatosplenomegaly and peristalsis is normoactive. Extremities have no cyanosis or edema, full range of motion is present. Skin is warm and dry without rash. Neurologic: Mental status is normal, cranial nerves are intact, there are no motor or sensory deficits.  ED Treatments / Results  Labs (all labs ordered are listed, but only abnormal results are displayed) Labs Reviewed  RAPID STREP SCREEN (NOT AT [facility])  CULTURE, GROUP A STREP [facility])    Procedures Procedures (including critical care time)  Medications Ordered in ED Medications - No data to display   Initial Impression /  Assessment and Plan / ED Course  I have reviewed the triage vital signs and the nursing notes.  Pertinent lab results that were available during my care of the patient were reviewed by me and considered in my medical decision making (see chart for details).  Upper respiratory infection, possible influenza.  He is outside the window for treatment of influenza, so no indication for testing.  Will send strep screen to rule out streptococcal pharyngitis.  Review of old records shows he was in the ED 6 weeks ago for streptococcal pharyngitis.  Strep screen is negative, culture pending.  He is discharged with instructions to use over-the-counter medications as needed for his URI, sore throat,  cough.  Advised to stop smoking, and advised to get the influenza immunization once he is recovered from current infection.  Final Clinical Impressions(s) / ED Diagnoses   Final diagnoses:  Viral URI with cough    ED Discharge Orders    None       Dione BoozeGlick, Cainen Burnham, MD 03/19/17 [phone]

## 2017-03-19 NOTE — ED Triage Notes (Signed)
Pt c/o cough, congestion sore throat x 3-4 days.

## 2017-03-21 LAB — CULTURE, GROUP A STREP (THRC)

## 2017-04-07 ENCOUNTER — Encounter (HOSPITAL_BASED_OUTPATIENT_CLINIC_OR_DEPARTMENT_OTHER): Payer: Self-pay | Admitting: *Deleted

## 2017-04-07 ENCOUNTER — Emergency Department (HOSPITAL_BASED_OUTPATIENT_CLINIC_OR_DEPARTMENT_OTHER)
Admission: EM | Admit: 2017-04-07 | Discharge: 2017-04-07 | Disposition: A | Discharge status: Home / Self Care | Payer: Self-pay | Attending: Emergency Medicine | Admitting: Emergency Medicine

## 2017-04-07 ENCOUNTER — Other Ambulatory Visit: Payer: Self-pay

## 2017-04-07 DIAGNOSIS — Z8619 Personal history of other infectious and parasitic diseases: Secondary | ICD-10-CM | POA: Insufficient documentation

## 2017-04-07 DIAGNOSIS — Z711 Person with feared health complaint in whom no diagnosis is made: Secondary | ICD-10-CM | POA: Insufficient documentation

## 2017-04-07 DIAGNOSIS — R369 Urethral discharge, unspecified: Secondary | ICD-10-CM | POA: Insufficient documentation

## 2017-04-07 DIAGNOSIS — F17210 Nicotine dependence, cigarettes, uncomplicated: Secondary | ICD-10-CM | POA: Insufficient documentation

## 2017-04-07 LAB — GC/CHLAMYDIA PROBE AMP (~~LOC~~) NOT AT ARMC
CHLAMYDIA, DNA PROBE: NEGATIVE
Neisseria Gonorrhea: NEGATIVE

## 2017-04-07 MED ORDER — AZITHROMYCIN 250 MG PO TABS
1000.0000 mg | ORAL_TABLET | Freq: Once | ORAL | Status: AC
  Administered 2017-04-07: 1000 mg via ORAL
  Filled 2017-04-07: qty 4

## 2017-04-07 MED ORDER — CEFTRIAXONE SODIUM 250 MG IJ SOLR
250.0000 mg | Freq: Once | INTRAMUSCULAR | Status: AC
  Administered 2017-04-07: 250 mg via INTRAMUSCULAR
  Filled 2017-04-07: qty 250

## 2017-04-07 NOTE — Discharge Instructions (Signed)
You were seen today with concerns for sexually transmitted disease.  Abstain from sexual activity for the next 10 days.  Always use condoms to prevent transmission of sexually transmitted diseases.

## 2017-04-07 NOTE — ED Provider Notes (Signed)
[facility] EMERGENCY DEPARTMENT Provider Note   CSN: [id number] Arrival date & time: 04/07/17  0410     History   Chief Complaint Chief Complaint  Patient presents with  . Penile Discharge    HPI Allen Diaz is a 22 y.o. male.  HPI  This is a 22 year old male with a history of chlamydia, herpes, gonorrhea who presents with concerns for STD.  Patient reports a partner tested positive for gonorrhea.  Patient reports penile discharge for the last week.  Denies any other symptoms.  He reports inconsistent condom use.  Denies dysuria or back pain.  Past Medical History:  Diagnosis Date  . Chlamydia   . Genital herpes   . Gonorrhea   . STD (male)     There are no active problems to display for this patient.   Past Surgical History:  Procedure Laterality Date  . 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 CARDIAC VALVE SURGERY     as a child   . HIP SURGERY    . LEG SURGERY         Home Medications    Prior to Admission medications   Not on File    Family History History reviewed. No pertinent family history.  Social History Social History   Tobacco Use  . Smoking status: Current Every Day Smoker    Packs/day: 1.00    Types: Cigarettes  . Smokeless tobacco: Never Used  Substance Use Topics  . Alcohol use: Yes    Comment: occ  . Drug use: No     Allergies   Patient has no known allergies.   Review of Systems Review of Systems  Genitourinary: Positive for discharge. Negative for dysuria, penile pain, penile swelling and testicular pain.  Musculoskeletal: Negative for back pain.  All other systems reviewed and are negative.    Physical Exam Updated Vital Signs BP 122/82 (BP Location: Left Arm)   Pulse 85   Temp 98.3 F (36.8 C) (Oral)   Resp 16   Ht 5\' 11"  (1.803 m)   Wt 63.5 kg (140 lb)   SpO2 100%   BMI 19.53 kg/m   Physical Exam  Constitutional: He is oriented to person, place,  and time. He appears well-developed and well-nourished. No distress.  HENT:  Head: Normocephalic and atraumatic.  Cardiovascular: Normal rate, regular rhythm and normal heart sounds.  Pulmonary/Chest: Effort normal and breath sounds normal. No respiratory distress. He has no wheezes.  Genitourinary:  Genitourinary Comments: Normal circumcised penis, no discharge noted, normal testicular lie  Musculoskeletal: He exhibits no edema.  Neurological: He is alert and oriented to person, place, and time.  Skin: Skin is warm and dry.  Psychiatric: He has a normal mood and affect.  Nursing note and vitals reviewed.    ED Treatments / Results  Labs (all labs ordered are listed, but only abnormal results are displayed) Labs Reviewed  GC/CHLAMYDIA PROBE AMP ([place]) NOT AT [facility]    EKG  EKG Interpretation None       Radiology No results found.  Procedures Procedures (including critical care time)  Medications Ordered in ED Medications  cefTRIAXone (ROCEPHIN) injection 250 mg (not administered)  azithromycin (ZITHROMAX) tablet 1,000 mg (not administered)     Initial Impression / Assessment and Plan / ED Course  I have reviewed the triage vital signs and the nursing notes.  Pertinent labs & imaging results that were available  during my care of the patient were reviewed by me and considered in my medical decision making (see chart for details).     Presents with concern for STD exposure and symptoms.  Nontoxic on exam.  Vital signs are reassuring.  Patient tested and treated for STDs.  Instructed to abstain from sexual activity for the next 10 days and always use condoms for protection.  Final Clinical Impressions(s) / ED Diagnoses   Final diagnoses:  Concern about STD in male without diagnosis  Penile discharge    ED Discharge Orders    None       Horton, Mayer Maskerourtney F, MD 04/07/17 [phone]

## 2017-04-07 NOTE — ED Triage Notes (Signed)
Reports last sexual contact reported having "shlamydia or gonorrhea, not sure which", "unsure of sx onset", states, "I take a medicine for herpes".  Alert, NAD, calm, interactive, resps e/u, speaking in clear complete sentences, no dyspnea noted, skin W&D, VSS, (denies: pain, rash, lesions, NVD, fever, dizziness or other sx). EDP into room.

## 2017-04-07 NOTE — ED Notes (Signed)
[facility]ral swab sample obtained, chaperone present.

## 2017-04-18 ENCOUNTER — Encounter (HOSPITAL_BASED_OUTPATIENT_CLINIC_OR_DEPARTMENT_OTHER): Payer: Self-pay | Admitting: *Deleted

## 2017-04-18 ENCOUNTER — Other Ambulatory Visit: Payer: Self-pay

## 2017-04-18 ENCOUNTER — Emergency Department (HOSPITAL_BASED_OUTPATIENT_CLINIC_OR_DEPARTMENT_OTHER)
Admission: EM | Admit: 2017-04-18 | Discharge: 2017-04-18 | Disposition: A | Discharge status: Home / Self Care | Payer: Self-pay | Attending: Emergency Medicine | Admitting: Emergency Medicine

## 2017-04-18 ENCOUNTER — Emergency Department (HOSPITAL_BASED_OUTPATIENT_CLINIC_OR_DEPARTMENT_OTHER): Payer: Self-pay

## 2017-04-18 DIAGNOSIS — R69 Illness, unspecified: Secondary | ICD-10-CM

## 2017-04-18 DIAGNOSIS — F17210 Nicotine dependence, cigarettes, uncomplicated: Secondary | ICD-10-CM | POA: Insufficient documentation

## 2017-04-18 DIAGNOSIS — J111 Influenza due to unidentified influenza virus with other respiratory manifestations: Secondary | ICD-10-CM | POA: Insufficient documentation

## 2017-04-18 MED ORDER — BENZONATATE 100 MG PO CAPS: 100.0000 mg | ORAL_CAPSULE | Freq: Three times a day (TID) | ORAL | 0 refills | Status: DC | PRN

## 2017-04-18 MED ORDER — ACETAMINOPHEN 325 MG PO TABS
ORAL_TABLET | ORAL | Status: AC
  Filled 2017-04-18: qty 2

## 2017-04-18 MED ORDER — IBUPROFEN 600 MG PO TABS: 600.0000 mg | ORAL_TABLET | Freq: Four times a day (QID) | ORAL | 0 refills | Status: DC | PRN

## 2017-04-18 MED ORDER — ACETAMINOPHEN 325 MG PO TABS
650.0000 mg | ORAL_TABLET | Freq: Once | ORAL | Status: AC | PRN
  Administered 2017-04-18: 650 mg via ORAL

## 2017-04-18 MED FILL — BENZONATATE 100 MG CAPSULE: 100 | 7 days supply | Qty: 21 | Fill #0

## 2017-04-18 MED FILL — IBUPROFEN 600 MG TABLET: 600 | 7 days supply | Qty: 30 | Fill #0

## 2017-04-18 NOTE — ED Provider Notes (Signed)
[facility] EMERGENCY DEPARTMENT Provider Note   CSN: [id number] Arrival date & time: 04/18/17  0700     History   Chief Complaint Chief Complaint  Patient presents with  . Cough    HPI Allen Diaz is a 22 y.o. male.  HPI Patient with 3 days of nonproductive cough.  He complains of nasal congestion and frontal headache as well as diffuse body aches and subjective fever and chills starting last night.  No known sick contacts.  Patient states he did not get a flu shot this year.  Has taken no medication at home for symptoms. Past Medical History:  Diagnosis Date  . Chlamydia   . Genital herpes   . Gonorrhea   . STD (male)     There are no active problems to display for this patient.   Past Surgical History:  Procedure Laterality Date  . 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 CARDIAC VALVE SURGERY     as a child   . HIP SURGERY    . LEG SURGERY         Home Medications    Prior to Admission medications   Medication Sig Start Date End Date Taking? Authorizing Provider  benzonatate (TESSALON) 100 MG capsule Take 1 capsule (100 mg total) by mouth 3 (three) times daily as needed for cough. 04/18/17   Loren RacerYelverton, Taeshawn Helfman, MD  ibuprofen (ADVIL,MOTRIN) 600 MG tablet Take 1 tablet (600 mg total) by mouth every 6 (six) hours as needed for fever, headache or moderate pain. 04/18/17   Loren RacerYelverton, Reginald Mangels, MD    Family History History reviewed. No pertinent family history.  Social History Social History   Tobacco Use  . Smoking status: Current Every Day Smoker    Packs/day: 1.00    Types: Cigarettes  . Smokeless tobacco: Never Used  Substance Use Topics  . Alcohol use: Yes    Comment: occ  . Drug use: No     Allergies   Patient has no known allergies.   Review of Systems Review of Systems  Constitutional: Positive for chills, fatigue and fever.  HENT: Positive for congestion. Negative for sore throat.   Eyes:  Negative for visual disturbance.  Respiratory: Positive for cough. Negative for shortness of breath.   Cardiovascular: Negative for chest pain.  Gastrointestinal: Negative for abdominal pain, diarrhea, nausea and vomiting.  Genitourinary: Negative for dysuria and flank pain.  Musculoskeletal: Positive for myalgias. Negative for back pain and neck pain.  Skin: Negative for rash and wound.  Neurological: Positive for headaches. Negative for dizziness, weakness, light-headedness and numbness.  All other systems reviewed and are negative.    Physical Exam Updated Vital Signs BP 121/75 (BP Location: Left Arm)   Pulse (!) 106   Temp (!) 101 F (38.3 C) (Oral)   Resp 18   Ht 5\' 11"  (1.803 m)   Wt 63.5 kg (140 lb)   SpO2 97%   BMI 19.53 kg/m   Physical Exam  Constitutional: He is oriented to person, place, and time. He appears well-developed and well-nourished.  HENT:  Head: Normocephalic and atraumatic.  Mouth/Throat: Oropharynx is clear and moist. No oropharyngeal exudate.  No sinus tenderness to percussion.  Oropharynx is clear.  Eyes: EOM are normal. Pupils are equal, round, and reactive to light.  Neck: Normal range of motion. Neck supple.  No meningismus.  No cervical lymphadenopathy.  Cardiovascular: Normal rate and regular rhythm. Exam  reveals no gallop and no friction rub.  No murmur heard. Pulmonary/Chest: Effort normal and breath sounds normal. No stridor. No respiratory distress. He has no wheezes. He has no rales. He exhibits no tenderness.  Abdominal: Soft. Bowel sounds are normal. There is no tenderness. There is no rebound and no guarding.  Musculoskeletal: Normal range of motion. He exhibits no edema or tenderness.  No CVA tenderness.  No lower extremity swelling or asymmetry.  Neurological: He is alert and oriented to person, place, and time.  Moves all extremities without focal deficit.  Sensation fully intact.  Ambulatory without difficulty.  Skin: Skin is warm  and dry. Capillary refill takes less than 2 seconds. No rash noted. No erythema.  Psychiatric: He has a normal mood and affect. His behavior is normal.  Nursing note and vitals reviewed.    ED Treatments / Results  Labs (all labs ordered are listed, but only abnormal results are displayed) Labs Reviewed - No data to display  EKG  EKG Interpretation None       Radiology Dg Chest 2 View  Result Date: 04/18/2017 CLINICAL DATA:  Cough for 3 days. EXAM: CHEST  2 VIEW COMPARISON:  None. FINDINGS: The heart size and mediastinal contours are within normal limits. Both lungs are clear. The visualized skeletal structures are unremarkable. IMPRESSION: Normal chest x-ray. Electronically Signed   By: Obie Dredge M.D.   On: 04/18/2017 08:13    Procedures Procedures (including critical care time)  Medications Ordered in ED Medications  acetaminophen (TYLENOL) tablet 650 mg (650 mg Oral Given 04/18/17 0736)     Initial Impression / Assessment and Plan / ED Course  I have reviewed the triage vital signs and the nursing notes.  Pertinent labs & imaging results that were available during my care of the patient were reviewed by me and considered in my medical decision making (see chart for details).     Patient is well-appearing.  Chest x-ray without evidence of pneumonia.  Consistent with flulike illness.  Will treat symptomatically.  Return precautions given.  Final Clinical Impressions(s) / ED Diagnoses   Final diagnoses:  Influenza-like illness    ED Discharge Orders        Ordered    ibuprofen (ADVIL,MOTRIN) 600 MG tablet  Every 6 hours PRN     04/18/17 0830    benzonatate (TESSALON) 100 MG capsule  3 times daily PRN     04/18/17 0830       Loren Racer, MD 04/18/17 0830

## 2017-04-18 NOTE — ED Notes (Signed)
ED Provider at bedside. 

## 2017-04-18 NOTE — ED Triage Notes (Signed)
Pt reports 3 days of cough with congestion and sinus pain, intermittent body aches and chills.

## 2017-06-26 ENCOUNTER — Encounter (HOSPITAL_BASED_OUTPATIENT_CLINIC_OR_DEPARTMENT_OTHER): Payer: Self-pay | Admitting: *Deleted

## 2017-06-26 ENCOUNTER — Other Ambulatory Visit: Payer: Self-pay

## 2017-06-26 ENCOUNTER — Emergency Department (HOSPITAL_BASED_OUTPATIENT_CLINIC_OR_DEPARTMENT_OTHER)
Admission: EM | Admit: 2017-06-26 | Discharge: 2017-06-26 | Disposition: A | Discharge status: Home / Self Care | Payer: Medicaid Other | Attending: Emergency Medicine | Admitting: Emergency Medicine

## 2017-06-26 DIAGNOSIS — R369 Urethral discharge, unspecified: Secondary | ICD-10-CM | POA: Diagnosis not present

## 2017-06-26 DIAGNOSIS — Z202 Contact with and (suspected) exposure to infections with a predominantly sexual mode of transmission: Secondary | ICD-10-CM | POA: Diagnosis not present

## 2017-06-26 DIAGNOSIS — F17210 Nicotine dependence, cigarettes, uncomplicated: Secondary | ICD-10-CM | POA: Diagnosis not present

## 2017-06-26 DIAGNOSIS — Z711 Person with feared health complaint in whom no diagnosis is made: Secondary | ICD-10-CM

## 2017-06-26 LAB — URINALYSIS, ROUTINE W REFLEX MICROSCOPIC
Bilirubin Urine: NEGATIVE
GLUCOSE, UA: NEGATIVE mg/dL
Hgb urine dipstick: NEGATIVE
Ketones, ur: NEGATIVE mg/dL
LEUKOCYTES UA: NEGATIVE
NITRITE: NEGATIVE
PROTEIN: NEGATIVE mg/dL
Specific Gravity, Urine: 1.02 (ref 1.005–1.030)
pH: 8.5 — ABNORMAL HIGH (ref 5.0–8.0)

## 2017-06-26 MED ORDER — CEFTRIAXONE SODIUM 250 MG IJ SOLR
250.0000 mg | Freq: Once | INTRAMUSCULAR | Status: AC
  Administered 2017-06-26: 250 mg via INTRAMUSCULAR
  Filled 2017-06-26: qty 250

## 2017-06-26 MED ORDER — AZITHROMYCIN 250 MG PO TABS
1000.0000 mg | ORAL_TABLET | Freq: Once | ORAL | Status: AC
Start: 2017-06-26 — End: 2017-06-26
  Administered 2017-06-26: 1000 mg via ORAL
  Filled 2017-06-26: qty 4

## 2017-06-26 NOTE — ED Provider Notes (Signed)
[facility] EMERGENCY DEPARTMENT Provider Note   CSN: [id number] Arrival date & time: 06/26/17  1442     History   Chief Complaint No chief complaint on file.   HPI Allen Diaz is a 22 y.o. male with history of chlamydia, gonorrhea, and genital herpes presents for evaluation of possible STD.  He states that he received a phone call from a former sexual partner that she tested positive for chlamydia.  He states that he has noticed a brownish discharge from the urethra for the past 2 days.  He denies dysuria, hematuria, melena, hematochezia, pain with bowel movements, abdominal pain, fevers, or chills.  He further denies genital sores, testicular pain or swelling, or erythema.  He states that in the past 6 months he has had 7 male sexual partners and does not always use protection.  The history is provided by the patient.    Past Medical History:  Diagnosis Date  . Chlamydia   . Genital herpes   . Gonorrhea   . STD (male)     There are no active problems to display for this patient.   Past Surgical History:  Procedure Laterality Date  . 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 CARDIAC VALVE SURGERY     as a child   . HIP SURGERY    . LEG SURGERY          Home Medications    Prior to Admission medications   Medication Sig Start Date End Date Taking? Authorizing Provider  benzonatate (TESSALON) 100 MG capsule Take 1 capsule (100 mg total) by mouth 3 (three) times daily as needed for cough. 04/18/17   Loren RacerYelverton, David, MD  ibuprofen (ADVIL,MOTRIN) 600 MG tablet Take 1 tablet (600 mg total) by mouth every 6 (six) hours as needed for fever, headache or moderate pain. 04/18/17   Loren RacerYelverton, David, MD    Family History No family history on file.  Social History Social History   Tobacco Use  . Smoking status: Current Every Day Smoker    Packs/day: 1.00    Types: Cigarettes  . Smokeless tobacco: Never Used  Substance  Use Topics  . Alcohol use: Yes    Comment: occ  . Drug use: No     Allergies   Patient has no known allergies.   Review of Systems Review of Systems  Constitutional: Negative for chills and fever.  Gastrointestinal: Negative for abdominal pain, blood in stool, constipation, diarrhea, nausea and vomiting.  Genitourinary: Positive for discharge. Negative for decreased urine volume, dysuria, flank pain, frequency, genital sores, hematuria, penile pain, penile swelling, scrotal swelling, testicular pain and urgency.     Physical Exam Updated Vital Signs BP 119/80   Pulse 94   Temp 99 F (37.2 C) (Oral)   Resp 16   Ht 5\' 11"  (1.803 m)   Wt 64.4 kg (142 lb)   SpO2 99%   BMI 19.80 kg/m   Physical Exam  Constitutional: He appears well-developed and well-nourished. No distress.  HENT:  Head: Normocephalic and atraumatic.  Eyes: Conjunctivae are normal. Right eye exhibits no discharge. Left eye exhibits no discharge.  Neck: No JVD present. No tracheal deviation present.  Cardiovascular: Normal rate, regular rhythm and normal heart sounds.  Pulmonary/Chest: Effort normal and breath sounds normal.  Abdominal: Soft. Bowel sounds are normal. He exhibits no distension. There is no tenderness. There is no guarding.  No CVA tenderness  Genitourinary:  Genitourinary Comments: Examination performed in the presence of a chaperone. No inguinal lymphadenopathy, no lesions or ulcerations to the genitalia, no penile drainage, erythema, or swelling. No scrotal swelling, erythema, or pain.  No tenderness to palpation of the penis.  No masses on palpation.    Musculoskeletal: Normal range of motion. He exhibits no edema or tenderness.  Neurological: He is alert.  Skin: Skin is warm and dry. No erythema.  Psychiatric: He has a normal mood and affect. His behavior is normal.  Nursing note and vitals reviewed.    ED Treatments / Results  Labs (all labs ordered are listed, but only abnormal  results are displayed) Labs Reviewed  URINALYSIS, ROUTINE W REFLEX MICROSCOPIC - Abnormal; Notable for the following components:      Result Value   pH 8.5 (*)    All other components within normal limits  RPR  HIV ANTIBODY (ROUTINE TESTING)  GC/CHLAMYDIA PROBE AMP ([place]) NOT AT [facility]    EKG None  Radiology No results found.  Procedures Procedures (including critical care time)  Medications Ordered in ED Medications  cefTRIAXone (ROCEPHIN) injection 250 mg (has no administration in time range)  azithromycin (ZITHROMAX) tablet 1,000 mg (has no administration in time range)     Initial Impression / Assessment and Plan / ED Course  I have reviewed the triage vital signs and the nursing notes.  Pertinent labs & imaging results that were available during my care of the patient were reviewed by me and considered in my medical decision making (see chart for details).     Patient presents stating a sexual partner told him she tested positive for gonorrhea.  He has had penile drainage for 2 days.  He is afebrile, vital signs are stable.  Patient is afebrile without abdominal tenderness, abdominal pain or painful bowel movements to indicate prostatitis.  No tenderness to palpation of the testes or epididymis to suggest orchitis or epididymitis.  STD cultures obtained including HIV, syphilis, gonorrhea and chlamydia. Patient to be discharged with instructions to follow up with PCP. Discussed importance of using protection when sexually active. Pt understands that they have GC/Chlamydia cultures pending and that they will need to inform all sexual partners if results return positive. Patient has been treated prophylactically with azithromycin and Rocephin.  Discussed strict ED return precautions. Pt verbalized understanding of and agreement with plan and is safe for discharge home at this time.   Final Clinical Impressions(s) / ED Diagnoses   Final diagnoses:  Concern about STD in  male without diagnosis    ED Discharge Orders    None       Bennye Alm 06/26/17 1539    Rolland Porter, MD 06/27/17 2025

## 2017-06-26 NOTE — Discharge Instructions (Signed)
Follow up with [facility]D clinic to be screened for HIV in the future and for future STD concerns or screenings. This is the recommendation by the CDC for people with multiple sexual partners or hx of STDs. You have been treated for gonorrhea and chlamydia in the ER but the hospital will call you if lab is positive.  Do not have sexual intercourse for 7 days after antibiotic treatment. ° °

## 2017-06-26 NOTE — ED Triage Notes (Signed)
STD exposure. States he was exposed to chlamydia. He has a penile discharge.

## 2017-06-27 LAB — RPR: RPR Ser Ql: NONREACTIVE

## 2017-06-27 LAB — HIV ANTIBODY (ROUTINE TESTING W REFLEX): HIV Screen 4th Generation wRfx: NONREACTIVE

## 2017-06-27 LAB — GC/CHLAMYDIA PROBE AMP (~~LOC~~) NOT AT ARMC
Chlamydia: NEGATIVE
Neisseria Gonorrhea: POSITIVE — AB

## 2017-10-29 ENCOUNTER — Other Ambulatory Visit: Payer: Self-pay

## 2017-10-29 ENCOUNTER — Encounter (HOSPITAL_BASED_OUTPATIENT_CLINIC_OR_DEPARTMENT_OTHER): Payer: Self-pay | Admitting: Emergency Medicine

## 2017-10-29 ENCOUNTER — Emergency Department (HOSPITAL_BASED_OUTPATIENT_CLINIC_OR_DEPARTMENT_OTHER)
Admission: EM | Admit: 2017-10-29 | Discharge: 2017-10-29 | Disposition: A | Discharge status: Home / Self Care | Payer: Medicaid Other | Attending: Emergency Medicine | Admitting: Emergency Medicine

## 2017-10-29 DIAGNOSIS — F17210 Nicotine dependence, cigarettes, uncomplicated: Secondary | ICD-10-CM | POA: Insufficient documentation

## 2017-10-29 DIAGNOSIS — Z202 Contact with and (suspected) exposure to infections with a predominantly sexual mode of transmission: Secondary | ICD-10-CM | POA: Diagnosis present

## 2017-10-29 DIAGNOSIS — Z711 Person with feared health complaint in whom no diagnosis is made: Secondary | ICD-10-CM

## 2017-10-29 LAB — URINALYSIS, MICROSCOPIC (REFLEX): RBC / HPF: NONE SEEN RBC/hpf (ref 0–5)

## 2017-10-29 LAB — URINALYSIS, ROUTINE W REFLEX MICROSCOPIC
Bilirubin Urine: NEGATIVE
Glucose, UA: NEGATIVE mg/dL
Hgb urine dipstick: NEGATIVE
Ketones, ur: NEGATIVE mg/dL
Leukocytes, UA: NEGATIVE
Nitrite: NEGATIVE
Protein, ur: 30 mg/dL — AB
Specific Gravity, Urine: 1.025 (ref 1.005–1.030)
pH: 6 (ref 5.0–8.0)

## 2017-10-29 MED ORDER — LIDOCAINE HCL (PF) 1 % IJ SOLN
INTRAMUSCULAR | Status: AC
  Administered 2017-10-29: 1.2 mL
  Filled 2017-10-29: qty 5

## 2017-10-29 MED ORDER — AZITHROMYCIN 250 MG PO TABS
1000.0000 mg | ORAL_TABLET | Freq: Once | ORAL | Status: AC
  Administered 2017-10-29: 1000 mg via ORAL
  Filled 2017-10-29: qty 4

## 2017-10-29 MED ORDER — CEFTRIAXONE SODIUM 250 MG IJ SOLR
250.0000 mg | Freq: Once | INTRAMUSCULAR | Status: AC
  Administered 2017-10-29: 250 mg via INTRAMUSCULAR
  Filled 2017-10-29: qty 250

## 2017-10-29 NOTE — ED Notes (Signed)
ED Provider at bedside. 

## 2017-10-29 NOTE — ED Provider Notes (Signed)
[facility] EMERGENCY DEPARTMENT Provider Note   CSN: [id number] Arrival date & time: 10/29/17  1019     History   Chief Complaint Chief Complaint  Patient presents with  . Exposure to STD    HPI Allen Diaz is a 22 y.o. male presents today with 2-week history of urethral discharge.  States that discharge is intermittent, brown in color notices it on his underwear sometimes.  Denies urinary frequency, urgency, dysuria, hematuria, melena, hematochezia, pain with bowel movements, abdominal pain, nausea, or vomiting.  No fevers.  States that he received a phone call yesterday from a recent sexual partner that she tested positive for chlamydia and recommended that he get testing and treatment.  He states he had 2 new male sexual partners in the past 6 months.  Declines testing for HIV or syphilis today.  The history is provided by the patient.    Past Medical History:  Diagnosis Date  . Chlamydia   . Genital herpes   . Gonorrhea   . STD (male)     There are no active problems to display for this patient.   Past Surgical History:  Procedure Laterality Date  . 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 CARDIAC VALVE SURGERY     as a child   . HIP SURGERY    . LEG SURGERY          Home Medications    Prior to Admission medications   Medication Sig Start Date End Date Taking? Authorizing Provider  benzonatate (TESSALON) 100 MG capsule Take 1 capsule (100 mg total) by mouth 3 (three) times daily as needed for cough. 04/18/17   Loren RacerYelverton, David, MD  ibuprofen (ADVIL,MOTRIN) 600 MG tablet Take 1 tablet (600 mg total) by mouth every 6 (six) hours as needed for fever, headache or moderate pain. 04/18/17   Loren RacerYelverton, David, MD    Family History No family history on file.  Social History Social History   Tobacco Use  . Smoking status: Current Every Day Smoker    Packs/day: 1.00    Types: Cigarettes  . Smokeless tobacco:  Never Used  . Tobacco comment: Black and Mild  Substance Use Topics  . Alcohol use: Yes    Comment: Weekly  . Drug use: No     Allergies   Patient has no known allergies.   Review of Systems Review of Systems  Constitutional: Negative for chills and fever.  Gastrointestinal: Negative for abdominal pain, nausea and vomiting.  Genitourinary: Positive for discharge. Negative for decreased urine volume, dysuria, flank pain, hematuria, penile pain, penile swelling, scrotal swelling and testicular pain.  All other systems reviewed and are negative.    Physical Exam Updated Vital Signs BP 115/66 (BP Location: Right Arm)   Pulse 83   Temp 98.3 F (36.8 C) (Oral)   Resp 18   SpO2 99%   Physical Exam  Constitutional: He appears well-developed and well-nourished. No distress.  HENT:  Head: Normocephalic and atraumatic.  Eyes: Conjunctivae are normal. Right eye exhibits no discharge. Left eye exhibits no discharge.  Neck: No JVD present. No tracheal deviation present.  Cardiovascular: Normal rate.  Pulmonary/Chest: Effort normal.  Abdominal: Soft. Bowel sounds are normal. He exhibits no distension. There is no tenderness. There is no guarding.  No CVA tenderness  Genitourinary:  Genitourinary Comments: Examination performed in the presence of a chaperone.  No inguinal lymphadenopathy, no masses or lesions to  the external genitalia.  No scrotal swelling or testicular tenderness to palpation.  No penile tenderness to palpation.  No significant penile discharge noted.  Musculoskeletal: He exhibits no edema.  Neurological: He is alert.  Skin: Skin is warm and dry. No erythema.  Psychiatric: He has a normal mood and affect. His behavior is normal.  Nursing note and vitals reviewed.    ED Treatments / Results  Labs (all labs ordered are listed, but only abnormal results are displayed) Labs Reviewed  URINALYSIS, ROUTINE W REFLEX MICROSCOPIC  GC/CHLAMYDIA PROBE AMP ([place])  NOT [facility]    EKG None  Radiology No results found.  Procedures Procedures (including critical care time)  Medications Ordered in ED Medications  cefTRIAXone (ROCEPHIN) injection 250 mg (250 mg Intramuscular Given 10/29/17 1123)  azithromycin (ZITHROMAX) tablet 1,000 mg (1,000 mg Oral Given 10/29/17 1120)  lidocaine (PF) (XYLOCAINE) 1 % injection (1.2 mLs  Given 10/29/17 1123)     Initial Impression / Assessment and Plan / ED Course  I have reviewed the triage vital signs and the nursing notes.  Pertinent labs & imaging results that were available during my care of the patient were reviewed by me and considered in my medical decision making (see chart for details).     Patient presenting with complaint of urethral discharge for 2 weeks.  Known exposure to chlamydia.  Vital signs are stable.  Patient is afebrile without abdominal tenderness, abdominal pain or painful bowel movements to indicate prostatitis.  No tenderness to palpation of the testes or epididymis to suggest orchitis or epididymitis.  UA without evidence of trichomonas.  STD cultures obtained including gonorrhea and chlamydia.  He refused HIV or syphilis testing today.  Patient to be discharged with instructions to follow up with PCP or urology if symptoms persist. Discussed importance of using protection when sexually active. Pt understands that they have GC/Chlamydia cultures pending and that they will need to inform all sexual partners if results return positive. Patient has been treated prophylactically with azithromycin and Rocephin.  Discussed strict ED return precautions. Pt verbalized understanding of and agreement with plan and is safe for discharge home at this time.   Final Clinical Impressions(s) / ED Diagnoses   Final diagnoses:  Concern about STD in male without diagnosis    ED Discharge Orders    None       Bennye AlmFawze, Faiga Stones A, PA-C 10/29/17 1202    Azalia Bilisampos, Kevin, MD 10/29/17 1212

## 2017-10-29 NOTE — Discharge Instructions (Signed)
Follow up with [facility]D clinic to be screened for HIV in the future and for future STD concerns or screenings. This is the recommendation by the CDC for people with multiple sexual partners or hx of STDs. You have been treated for gonorrhea and chlamydia in the ER but the hospital will call you if lab is positive.  Do not have sexual intercourse for 7 days after antibiotic treatment. ° °

## 2017-10-29 NOTE — ED Triage Notes (Signed)
Pt notified by sexual partner that they tested positive for Chlamydia. Pt sts he has been having a brown discharge x2 weeks but "I didn't think nothin of it".

## 2017-10-30 LAB — GC/CHLAMYDIA PROBE AMP (~~LOC~~) NOT AT ARMC
Chlamydia: POSITIVE — AB
Neisseria Gonorrhea: NEGATIVE

## 2018-01-18 ENCOUNTER — Emergency Department (HOSPITAL_BASED_OUTPATIENT_CLINIC_OR_DEPARTMENT_OTHER): Payer: Medicaid Other

## 2018-01-18 ENCOUNTER — Encounter (HOSPITAL_BASED_OUTPATIENT_CLINIC_OR_DEPARTMENT_OTHER): Payer: Self-pay | Admitting: *Deleted

## 2018-01-18 ENCOUNTER — Other Ambulatory Visit: Payer: Self-pay

## 2018-01-18 ENCOUNTER — Emergency Department (HOSPITAL_BASED_OUTPATIENT_CLINIC_OR_DEPARTMENT_OTHER)
Admission: EM | Admit: 2018-01-18 | Discharge: 2018-01-19 | Disposition: A | Discharge status: Home / Self Care | Payer: Medicaid Other | Attending: Emergency Medicine | Admitting: Emergency Medicine

## 2018-01-18 DIAGNOSIS — F17210 Nicotine dependence, cigarettes, uncomplicated: Secondary | ICD-10-CM | POA: Insufficient documentation

## 2018-01-18 DIAGNOSIS — W2209XA Striking against other stationary object, initial encounter: Secondary | ICD-10-CM | POA: Insufficient documentation

## 2018-01-18 DIAGNOSIS — Y929 Unspecified place or not applicable: Secondary | ICD-10-CM | POA: Diagnosis not present

## 2018-01-18 DIAGNOSIS — S6991XA Unspecified injury of right wrist, hand and finger(s), initial encounter: Secondary | ICD-10-CM | POA: Diagnosis present

## 2018-01-18 DIAGNOSIS — Y939 Activity, unspecified: Secondary | ICD-10-CM | POA: Insufficient documentation

## 2018-01-18 DIAGNOSIS — S60511A Abrasion of right hand, initial encounter: Secondary | ICD-10-CM | POA: Insufficient documentation

## 2018-01-18 DIAGNOSIS — Y999 Unspecified external cause status: Secondary | ICD-10-CM | POA: Insufficient documentation

## 2018-01-18 NOTE — ED Triage Notes (Signed)
Pt reports he "punched some things" 2 weeks ago and now his right hand is "messed up". C/o pain and swelling. Healing wound noted to right middle knuckle

## 2018-01-19 MED ORDER — NAPROXEN 500 MG PO TABS: 500.0000 mg | ORAL_TABLET | Freq: Two times a day (BID) | ORAL | 0 refills | Status: DC

## 2018-01-19 MED ORDER — NAPROXEN 250 MG PO TABS
500.0000 mg | ORAL_TABLET | Freq: Once | ORAL | Status: AC
  Administered 2018-01-19: 500 mg via ORAL
  Filled 2018-01-19: qty 2

## 2018-01-19 NOTE — Discharge Instructions (Addendum)
You were seen today for right hand pain.  Your x-rays do not show any evidence of fracture.  This may be related to the abrasion over your knuckle.  Take naproxen as needed for pain.  At this time there is no sign of infection.

## 2018-01-19 NOTE — ED Provider Notes (Signed)
[facility] EMERGENCY DEPARTMENT Provider Note   CSN: [id number] Arrival date & time: 01/18/18  2259     History   Chief Complaint Chief Complaint  Patient presents with  . Hand Injury    HPI Allen Diaz is a 22 y.o. male.  HPI  This is a 22 year old male who presents with right hand pain.  Patient reports approximately 2 weeks ago he punched multiple things including a wall on a window and sustained an injury to her right hand.  He has an abrasion over his right third knuckle.  He states that he has had some pain since that time.  He has not noted any redness or swelling.  States pain is mostly when he is up on that hand.  Currently pain is 3 out of 10.  He is right-handed.  Past Medical History:  Diagnosis Date  . Chlamydia   . Genital herpes   . Gonorrhea   . STD (male)     There are no active problems to display for this patient.   Past Surgical History:  Procedure Laterality Date  . 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 CARDIAC VALVE SURGERY     as a child   . HIP SURGERY    . LEG SURGERY          Home Medications    Prior to Admission medications   Medication Sig Start Date End Date Taking? Authorizing Provider  benzonatate (TESSALON) 100 MG capsule Take 1 capsule (100 mg total) by mouth 3 (three) times daily as needed for cough. 04/18/17   Loren Racer, MD  ibuprofen (ADVIL,MOTRIN) 600 MG tablet Take 1 tablet (600 mg total) by mouth every 6 (six) hours as needed for fever, headache or moderate pain. 04/18/17   Loren Racer, MD  naproxen (NAPROSYN) 500 MG tablet Take 1 tablet (500 mg total) by mouth 2 (two) times daily. 01/19/18   Horton, Mayer Masker, MD    Family History No family history on file.  Social History Social History   Tobacco Use  . Smoking status: Current Every Day Smoker    Packs/day: 1.00    Types: Cigarettes  . Smokeless tobacco: Never Used  . Tobacco comment: Black and  Mild  Substance Use Topics  . Alcohol use: Yes    Comment: Weekly  . Drug use: No     Allergies   Patient has no known allergies.   Review of Systems Review of Systems  Musculoskeletal:       Right hand pain  Skin: Positive for wound.  Neurological: Negative for weakness and numbness.  All other systems reviewed and are negative.    Physical Exam Updated Vital Signs BP 131/82 (BP Location: Left Arm)   Pulse 93   Temp 98.6 F (37 C) (Oral)   Resp 16   Ht 1.803 m (5\' 11" )   Wt 65.8 kg   SpO2 100%   BMI 20.22 kg/m   Physical Exam  Constitutional: He is oriented to person, place, and time. He appears well-developed and well-nourished. No distress.  HENT:  Head: Normocephalic and atraumatic.  Cardiovascular: Normal rate and regular rhythm.  Pulmonary/Chest: Effort normal. No respiratory distress.  Musculoskeletal: He exhibits no edema.  Abrasion noted over the right third knuckle, scabbing noted, no erythema or streaking noted, normal range of motion with flexion and extension of the fingers, no obvious deformities or swelling, 2+ radial pulse  Neurological: He is alert and oriented to person, place, and time.  Skin: Skin is warm and dry.  Psychiatric: He has a normal mood and affect.  Nursing note and vitals reviewed.    ED Treatments / Results  Labs (all labs ordered are listed, but only abnormal results are displayed) Labs Reviewed - No data to display  EKG None  Radiology Dg Hand Complete Right  Result Date: 01/19/2018 CLINICAL DATA:  Pain and swelling since punching something 2 weeks ago. EXAM: RIGHT HAND - COMPLETE 3+ VIEW COMPARISON:  None. FINDINGS: There is no evidence of fracture or dislocation. There is no evidence of arthropathy or other focal bone abnormality. Soft tissues are unremarkable. IMPRESSION: Negative. Electronically Signed   By: Gerome Sam III M.D   On: 01/19/2018 00:04    Procedures Procedures (including critical care  time)  Medications Ordered in ED Medications  naproxen (NAPROSYN) tablet 500 mg (has no administration in time range)     Initial Impression / Assessment and Plan / ED Course  I have reviewed the triage vital signs and the nursing notes.  Pertinent labs & imaging results that were available during my care of the patient were reviewed by me and considered in my medical decision making (see chart for details).     Patient presents with an abrasion to the right hand.  Reports this was sustained over 2 weeks ago.  Reports persistent pain.  There is a small abrasion but no other obvious deformities.  X-rays negative for fracture.  I have reviewed the x-rays myself.  No foreign body.  Recommend naproxen for pain.  After history, exam, and medical workup I feel the patient has been appropriately medically screened and is safe for discharge home. Pertinent diagnoses were discussed with the patient. Patient was given return precautions.   Final Clinical Impressions(s) / ED Diagnoses   Final diagnoses:  Hand abrasion, right, initial encounter    ED Discharge Orders         Ordered    naproxen (NAPROSYN) 500 MG tablet  2 times daily     01/19/18 0009           Horton, Mayer Masker, MD 01/19/18 [phone]

## 2018-01-19 NOTE — ED Notes (Signed)
Pt understood dc material. NAD noted. Script given at dc  

## 2018-01-31 ENCOUNTER — Inpatient Hospital Stay (HOSPITAL_COMMUNITY)
Admission: EM | Admit: 2018-01-31 | Discharge: 2018-02-02 | DRG: 087 | Disposition: A | Discharge status: Home / Self Care | Payer: Medicaid Other | Attending: General Surgery | Admitting: General Surgery

## 2018-01-31 ENCOUNTER — Inpatient Hospital Stay (HOSPITAL_COMMUNITY): Payer: Medicaid Other

## 2018-01-31 ENCOUNTER — Encounter (HOSPITAL_COMMUNITY): Payer: Self-pay

## 2018-01-31 ENCOUNTER — Emergency Department (HOSPITAL_COMMUNITY): Payer: Medicaid Other

## 2018-01-31 DIAGNOSIS — S0232XA Fracture of orbital floor, left side, initial encounter for closed fracture: Secondary | ICD-10-CM | POA: Diagnosis present

## 2018-01-31 DIAGNOSIS — S0240FA Zygomatic fracture, left side, initial encounter for closed fracture: Secondary | ICD-10-CM | POA: Diagnosis present

## 2018-01-31 DIAGNOSIS — Y92410 Unspecified street and highway as the place of occurrence of the external cause: Secondary | ICD-10-CM

## 2018-01-31 DIAGNOSIS — Z8679 Personal history of other diseases of the circulatory system: Secondary | ICD-10-CM

## 2018-01-31 DIAGNOSIS — F17210 Nicotine dependence, cigarettes, uncomplicated: Secondary | ICD-10-CM | POA: Diagnosis present

## 2018-01-31 DIAGNOSIS — M25521 Pain in right elbow: Secondary | ICD-10-CM | POA: Diagnosis present

## 2018-01-31 DIAGNOSIS — S066X0A Traumatic subarachnoid hemorrhage without loss of consciousness, initial encounter: Principal | ICD-10-CM | POA: Diagnosis present

## 2018-01-31 DIAGNOSIS — R402252 Coma scale, best verbal response, oriented, at arrival to emergency department: Secondary | ICD-10-CM | POA: Diagnosis present

## 2018-01-31 DIAGNOSIS — R402142 Coma scale, eyes open, spontaneous, at arrival to emergency department: Secondary | ICD-10-CM | POA: Diagnosis present

## 2018-01-31 DIAGNOSIS — S0292XA Unspecified fracture of facial bones, initial encounter for closed fracture: Secondary | ICD-10-CM

## 2018-01-31 DIAGNOSIS — Z8619 Personal history of other infectious and parasitic diseases: Secondary | ICD-10-CM

## 2018-01-31 DIAGNOSIS — S0240DA Maxillary fracture, left side, initial encounter for closed fracture: Secondary | ICD-10-CM | POA: Diagnosis present

## 2018-01-31 DIAGNOSIS — R918 Other nonspecific abnormal finding of lung field: Secondary | ICD-10-CM | POA: Diagnosis present

## 2018-01-31 DIAGNOSIS — R402362 Coma scale, best motor response, obeys commands, at arrival to emergency department: Secondary | ICD-10-CM | POA: Diagnosis present

## 2018-01-31 DIAGNOSIS — S27321A Contusion of lung, unilateral, initial encounter: Secondary | ICD-10-CM

## 2018-01-31 DIAGNOSIS — S02842A Fracture of lateral orbital wall, left side, initial encounter for closed fracture: Secondary | ICD-10-CM | POA: Diagnosis present

## 2018-01-31 DIAGNOSIS — I629 Nontraumatic intracranial hemorrhage, unspecified: Secondary | ICD-10-CM

## 2018-01-31 DIAGNOSIS — R51 Headache: Secondary | ICD-10-CM | POA: Diagnosis not present

## 2018-01-31 HISTORY — DX: Personal history of other diseases of the circulatory system: Z86.79

## 2018-01-31 LAB — URINALYSIS, ROUTINE W REFLEX MICROSCOPIC
Bilirubin Urine: NEGATIVE
Glucose, UA: NEGATIVE mg/dL
Ketones, ur: NEGATIVE mg/dL
Leukocytes, UA: NEGATIVE
Nitrite: NEGATIVE
SPECIFIC GRAVITY, URINE: 1.01 (ref 1.005–1.030)
pH: 7 (ref 5.0–8.0)

## 2018-01-31 LAB — I-STAT CHEM 8, ED
BUN: 9 mg/dL (ref 6–20)
CREATININE: 1.1 mg/dL (ref 0.61–1.24)
Calcium, Ion: 1.22 mmol/L (ref 1.15–1.40)
Chloride: 105 mmol/L (ref 98–111)
Glucose, Bld: 88 mg/dL (ref 70–99)
HCT: 51 % (ref 39.0–52.0)
Hemoglobin: 17.3 g/dL — ABNORMAL HIGH (ref 13.0–17.0)
Potassium: 3.4 mmol/L — ABNORMAL LOW (ref 3.5–5.1)
SODIUM: 144 mmol/L (ref 135–145)
TCO2: 27 mmol/L (ref 22–32)

## 2018-01-31 LAB — CBC
HCT: 47.4 % (ref 39.0–52.0)
HEMOGLOBIN: 16 g/dL (ref 13.0–17.0)
MCH: 33.2 pg (ref 26.0–34.0)
MCHC: 33.8 g/dL (ref 30.0–36.0)
MCV: 98.3 fL (ref 80.0–100.0)
Platelets: 108 10*3/uL — ABNORMAL LOW (ref 150–400)
RBC: 4.82 MIL/uL (ref 4.22–5.81)
RDW: 12.7 % (ref 11.5–15.5)
WBC: 7.4 10*3/uL (ref 4.0–10.5)
nRBC: 0 % (ref 0.0–0.2)

## 2018-01-31 LAB — COMPREHENSIVE METABOLIC PANEL
ALT: 106 U/L — ABNORMAL HIGH (ref 0–44)
ANION GAP: 11 (ref 5–15)
AST: 120 U/L — ABNORMAL HIGH (ref 15–41)
Albumin: 4.5 g/dL (ref 3.5–5.0)
Alkaline Phosphatase: 55 U/L (ref 38–126)
BILIRUBIN TOTAL: 0.5 mg/dL (ref 0.3–1.2)
BUN: 7 mg/dL (ref 6–20)
CO2: 24 mmol/L (ref 22–32)
Calcium: 9.5 mg/dL (ref 8.9–10.3)
Chloride: 105 mmol/L (ref 98–111)
Creatinine, Ser: 0.99 mg/dL (ref 0.61–1.24)
GFR calc Af Amer: >60 mL/min (ref >60)
GFR calc non Af Amer: >60 mL/min (ref >60)
Glucose, Bld: 91 mg/dL (ref 70–99)
POTASSIUM: 3.2 mmol/L — AB (ref 3.5–5.1)
Sodium: 140 mmol/L (ref 135–145)
TOTAL PROTEIN: 7.7 g/dL (ref 6.5–8.1)

## 2018-01-31 LAB — SAMPLE TO BLOOD BANK

## 2018-01-31 LAB — PROTIME-INR
INR: 0.99
Prothrombin Time: 13 seconds (ref 11.4–15.2)

## 2018-01-31 LAB — MRSA PCR SCREENING: MRSA by PCR: NEGATIVE

## 2018-01-31 LAB — ETHANOL: ALCOHOL ETHYL (B): 104 mg/dL — AB (ref <10)

## 2018-01-31 LAB — I-STAT CG4 LACTIC ACID, ED: LACTIC ACID, VENOUS: 2.98 mmol/L — AB (ref 0.5–1.9)

## 2018-01-31 MED ORDER — MORPHINE SULFATE (PF) 2 MG/ML IV SOLN
2.0000 mg | INTRAVENOUS | Status: DC | PRN
  Administered 2018-01-31 – 2018-02-02 (×6): 2 mg via INTRAVENOUS
  Filled 2018-01-31 (×8): qty 1

## 2018-01-31 MED ORDER — ACETAMINOPHEN 325 MG PO TABS: 650.0000 mg | ORAL_TABLET | ORAL | Status: DC | PRN

## 2018-01-31 MED ORDER — ONDANSETRON HCL 4 MG/2ML IJ SOLN: 4.0000 mg | Freq: Four times a day (QID) | INTRAMUSCULAR | Status: DC | PRN

## 2018-01-31 MED ORDER — SODIUM CHLORIDE 0.9 % IV BOLUS
1000.0000 mL | Freq: Once | INTRAVENOUS | Status: AC
  Administered 2018-01-31: 1000 mL via INTRAVENOUS

## 2018-01-31 MED ORDER — IBUPROFEN 800 MG PO TABS
800.0000 mg | ORAL_TABLET | Freq: Once | ORAL | Status: AC
  Administered 2018-01-31: 800 mg via ORAL
  Filled 2018-01-31: qty 1

## 2018-01-31 MED ORDER — IOHEXOL 300 MG/ML  SOLN
100.0000 mL | Freq: Once | INTRAMUSCULAR | Status: AC | PRN
  Administered 2018-01-31: 100 mL via INTRAVENOUS

## 2018-01-31 MED ORDER — AMOXICILLIN-POT CLAVULANATE 875-125 MG PO TABS
1.0000 | ORAL_TABLET | Freq: Two times a day (BID) | ORAL | Status: DC
  Administered 2018-01-31 – 2018-02-02 (×4): 1 via ORAL
  Filled 2018-01-31 (×5): qty 1

## 2018-01-31 MED ORDER — TETANUS-DIPHTH-ACELL PERTUSSIS 5-2.5-18.5 LF-MCG/0.5 IM SUSP
0.5000 mL | Freq: Once | INTRAMUSCULAR | Status: AC
  Administered 2018-01-31: 0.5 mL via INTRAMUSCULAR
  Filled 2018-01-31: qty 0.5

## 2018-01-31 MED ORDER — SODIUM CHLORIDE 0.9 % IV SOLN
INTRAVENOUS | Status: DC
  Administered 2018-01-31 – 2018-02-01 (×3): via INTRAVENOUS

## 2018-01-31 MED ORDER — OXYCODONE HCL 5 MG PO TABS
5.0000 mg | ORAL_TABLET | ORAL | Status: DC | PRN
  Administered 2018-01-31 – 2018-02-02 (×2): 5 mg via ORAL
  Filled 2018-01-31 (×2): qty 1

## 2018-01-31 MED ORDER — ONDANSETRON 4 MG PO TBDP: 4.0000 mg | ORAL_TABLET | Freq: Four times a day (QID) | ORAL | Status: DC | PRN

## 2018-01-31 NOTE — ED Notes (Signed)
Dr Wilkie AyeHorton informed of lactic acid results 2.98
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
## 2018-01-31 NOTE — ED Notes (Signed)
Patient transported to CT 
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
## 2018-01-31 NOTE — ED Notes (Signed)
Neuro surgery at bedside.
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
## 2018-01-31 NOTE — ED Notes (Addendum)
Pt stated he is still having L-sided facial pain;This RN offered pt pain medicine; pt declined pain medicine at this time

## 2018-01-31 NOTE — ED Notes (Signed)
Paged ENT Dr. Kenney Housemanrab
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
## 2018-01-31 NOTE — ED Notes (Signed)
Attempted report 

## 2018-02-01 ENCOUNTER — Other Ambulatory Visit: Payer: Self-pay

## 2018-02-01 ENCOUNTER — Inpatient Hospital Stay (HOSPITAL_COMMUNITY): Payer: Medicaid Other

## 2018-02-01 LAB — BASIC METABOLIC PANEL
Anion gap: 7 (ref 5–15)
BUN: 11 mg/dL (ref 6–20)
CHLORIDE: 106 mmol/L (ref 98–111)
CO2: 24 mmol/L (ref 22–32)
Calcium: 8.6 mg/dL — ABNORMAL LOW (ref 8.9–10.3)
Creatinine, Ser: 0.85 mg/dL (ref 0.61–1.24)
GFR calc Af Amer: >60 mL/min (ref >60)
GFR calc non Af Amer: >60 mL/min (ref >60)
GLUCOSE: 95 mg/dL (ref 70–99)
POTASSIUM: 3.6 mmol/L (ref 3.5–5.1)
Sodium: 137 mmol/L (ref 135–145)
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
## 2018-02-01 NOTE — Plan of Care (Signed)
  Problem: Education: Goal: Knowledge of General Education information will improve Description: Including pain rating scale, medication(s)/side effects and non-pharmacologic comfort measures Outcome: Progressing   Problem: Activity: Goal: Risk for activity intolerance will decrease Outcome: Progressing   Problem: Nutrition: Goal: Adequate nutrition will be maintained Outcome: Progressing   Problem: Coping: Goal: Level of anxiety will decrease Outcome: Progressing   

## 2018-02-02 LAB — CBC
HCT: 39.9 % (ref 39.0–52.0)
HEMOGLOBIN: 13.6 g/dL (ref 13.0–17.0)
MCH: 34 pg (ref 26.0–34.0)
MCHC: 34.1 g/dL (ref 30.0–36.0)
MCV: 99.8 fL (ref 80.0–100.0)
Platelets: 199 10*3/uL (ref 150–400)
RBC: 4 MIL/uL — AB (ref 4.22–5.81)
RDW: 12.9 % (ref 11.5–15.5)
WBC: 7.7 10*3/uL (ref 4.0–10.5)
nRBC: 0 % (ref 0.0–0.2)

## 2018-02-02 MED ORDER — HYDROCODONE-ACETAMINOPHEN 7.5-325 MG/15ML PO SOLN
10.0000 mL | ORAL | Status: DC | PRN
  Administered 2018-02-02: 15 mL via ORAL
  Filled 2018-02-02: qty 15

## 2018-02-02 MED ORDER — AMOXICILLIN-POT CLAVULANATE 600-42.9 MG/5ML PO SUSR: 30.0000 mg/kg/d | Freq: Two times a day (BID) | ORAL | 0 refills | Status: DC

## 2018-02-02 MED ORDER — HYDROCODONE-ACETAMINOPHEN 7.5-325 MG/15ML PO SOLN: 10.0000 mL | ORAL | 0 refills | Status: AC | PRN

## 2018-02-02 NOTE — Plan of Care (Signed)
  Problem: Pain Managment: Goal: General experience of comfort will improve Outcome: Progressing   Problem: Skin Integrity: Goal: Risk for impaired skin integrity will decrease Outcome: Progressing   
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
## 2018-02-02 NOTE — Progress Notes (Signed)
Discharge instructions reviewed with pt and pt's fiance and prescriptions given.  Pt and pt's fiance verbalized understanding and had no questions.  Pt discharged in stable condition with fiance.  Hector ShadeMoss, Cristle Jared [place]

## 2018-02-02 NOTE — Progress Notes (Signed)
SBIRT completed.  Pt denies problem with ETOH.  No referral needed.  Quintella BatonJulie W. Tayven Renteria, RN, BSN  Trauma/Neuro ICU Case Manager [phone]

## 2018-02-10 ENCOUNTER — Other Ambulatory Visit: Payer: Self-pay

## 2018-02-10 ENCOUNTER — Encounter (HOSPITAL_BASED_OUTPATIENT_CLINIC_OR_DEPARTMENT_OTHER): Payer: Self-pay | Admitting: *Deleted

## 2018-02-11 ENCOUNTER — Other Ambulatory Visit: Payer: Self-pay

## 2018-02-11 ENCOUNTER — Encounter (HOSPITAL_BASED_OUTPATIENT_CLINIC_OR_DEPARTMENT_OTHER): Payer: Self-pay | Admitting: *Deleted

## 2018-02-11 ENCOUNTER — Emergency Department (HOSPITAL_BASED_OUTPATIENT_CLINIC_OR_DEPARTMENT_OTHER)
Admission: EM | Admit: 2018-02-11 | Discharge: 2018-02-11 | Disposition: A | Discharge status: Home / Self Care | Payer: Medicaid Other | Attending: Emergency Medicine | Admitting: Emergency Medicine

## 2018-02-11 DIAGNOSIS — I456 Pre-excitation syndrome: Secondary | ICD-10-CM | POA: Diagnosis not present

## 2018-02-11 DIAGNOSIS — S0280XS Fracture of other specified skull and facial bones, unspecified side, sequela: Secondary | ICD-10-CM | POA: Diagnosis not present

## 2018-02-11 DIAGNOSIS — S0993XS Unspecified injury of face, sequela: Secondary | ICD-10-CM | POA: Diagnosis present

## 2018-02-11 DIAGNOSIS — S0292XS Unspecified fracture of facial bones, sequela: Secondary | ICD-10-CM

## 2018-02-11 DIAGNOSIS — F17210 Nicotine dependence, cigarettes, uncomplicated: Secondary | ICD-10-CM | POA: Insufficient documentation

## 2018-02-11 MED ORDER — HYDROCODONE-ACETAMINOPHEN 7.5-325 MG/15ML PO SOLN: 15.0000 mL | Freq: Four times a day (QID) | ORAL | 0 refills | Status: AC | PRN
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
## 2018-02-11 NOTE — ED Notes (Signed)
Pt states need more pain medication

## 2018-02-11 NOTE — Discharge Instructions (Signed)
You were seen in the ED with continued face pain. Keep your appointment for surgery. Call the PCP listed to schedule a follow up appointment. Return to the ED with any new or worsening symptoms.
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
## 2018-02-11 NOTE — ED Notes (Signed)
ED Provider at bedside. 

## 2018-02-13 ENCOUNTER — Ambulatory Visit: Payer: Self-pay | Admitting: Oral Surgery

## 2018-02-17 ENCOUNTER — Ambulatory Visit (HOSPITAL_BASED_OUTPATIENT_CLINIC_OR_DEPARTMENT_OTHER): Payer: Medicaid Other | Admitting: Certified Registered"

## 2018-02-17 ENCOUNTER — Encounter (HOSPITAL_BASED_OUTPATIENT_CLINIC_OR_DEPARTMENT_OTHER): Payer: Self-pay | Admitting: *Deleted

## 2018-02-17 ENCOUNTER — Encounter (HOSPITAL_BASED_OUTPATIENT_CLINIC_OR_DEPARTMENT_OTHER)
Admission: RE | Disposition: A | Discharge status: Home / Self Care | Payer: Self-pay | Source: Ambulatory Visit | Attending: Oral Surgery

## 2018-02-17 ENCOUNTER — Other Ambulatory Visit: Payer: Self-pay

## 2018-02-17 ENCOUNTER — Ambulatory Visit (HOSPITAL_BASED_OUTPATIENT_CLINIC_OR_DEPARTMENT_OTHER)
Admission: RE | Admit: 2018-02-17 | Discharge: 2018-02-17 | Disposition: A | Discharge status: Home / Self Care | Payer: Medicaid Other | Source: Ambulatory Visit | Attending: Oral Surgery | Admitting: Oral Surgery

## 2018-02-17 DIAGNOSIS — Y92410 Unspecified street and highway as the place of occurrence of the external cause: Secondary | ICD-10-CM | POA: Insufficient documentation

## 2018-02-17 DIAGNOSIS — F17210 Nicotine dependence, cigarettes, uncomplicated: Secondary | ICD-10-CM | POA: Insufficient documentation

## 2018-02-17 DIAGNOSIS — S0240FB Zygomatic fracture, left side, initial encounter for open fracture: Secondary | ICD-10-CM | POA: Insufficient documentation

## 2018-02-17 DIAGNOSIS — S0240FA Zygomatic fracture, left side, initial encounter for closed fracture: Secondary | ICD-10-CM | POA: Diagnosis present

## 2018-02-17 HISTORY — PX: ORIF ORBITAL FRACTURE: SHX5312

## 2018-02-17 HISTORY — DX: Family history of other specified conditions: Z84.89

## 2018-02-17 HISTORY — DX: Personal history of other diseases of the circulatory system: Z86.79

## 2018-02-17 SURGERY — OPEN REDUCTION INTERNAL FIXATION (ORIF) ORBITAL FRACTURE
Anesthesia: General | Site: Face | Laterality: Left

## 2018-02-17 MED ORDER — BACITRACIN-POLYMYXIN B 500-10000 UNIT/GM OP OINT
TOPICAL_OINTMENT | OPHTHALMIC | Status: AC
  Filled 2018-02-17: qty 3.5

## 2018-02-17 MED ORDER — HYDROMORPHONE HCL 1 MG/ML IJ SOLN
0.2500 mg | INTRAMUSCULAR | Status: DC | PRN
  Administered 2018-02-17: 0.5 mg via INTRAVENOUS

## 2018-02-17 MED ORDER — BACITRACIN 500 UNIT/GM EX OINT
TOPICAL_OINTMENT | CUTANEOUS | Status: DC | PRN
  Administered 2018-02-17: 1 via TOPICAL

## 2018-02-17 MED ORDER — LIDOCAINE-EPINEPHRINE 2 %-1:100000 IJ SOLN
INTRAMUSCULAR | Status: AC
  Filled 2018-02-17: qty 1.7

## 2018-02-17 MED ORDER — ROCURONIUM BROMIDE 50 MG/5ML IV SOSY
PREFILLED_SYRINGE | INTRAVENOUS | Status: AC
  Filled 2018-02-17: qty 5

## 2018-02-17 MED ORDER — ONDANSETRON HCL 4 MG/2ML IJ SOLN
INTRAMUSCULAR | Status: DC | PRN
  Administered 2018-02-17: 4 mg via INTRAVENOUS

## 2018-02-17 MED ORDER — BUPIVACAINE-EPINEPHRINE (PF) 0.25% -1:200000 IJ SOLN
INTRAMUSCULAR | Status: AC
  Filled 2018-02-17: qty 30

## 2018-02-17 MED ORDER — ROCURONIUM BROMIDE 100 MG/10ML IV SOLN
INTRAVENOUS | Status: DC | PRN
  Administered 2018-02-17: 50 mg via INTRAVENOUS
  Administered 2018-02-17 (×2): 10 mg via INTRAVENOUS
  Administered 2018-02-17: 20 mg via INTRAVENOUS

## 2018-02-17 MED ORDER — PROPOFOL 10 MG/ML IV BOLUS
INTRAVENOUS | Status: AC
  Filled 2018-02-17: qty 20

## 2018-02-17 MED ORDER — DEXMEDETOMIDINE HCL 200 MCG/2ML IV SOLN
INTRAVENOUS | Status: DC | PRN
  Administered 2018-02-17 (×4): 8 ug via INTRAVENOUS

## 2018-02-17 MED ORDER — FENTANYL CITRATE (PF) 100 MCG/2ML IJ SOLN
INTRAMUSCULAR | Status: DC | PRN
  Administered 2018-02-17 (×6): 50 ug via INTRAVENOUS

## 2018-02-17 MED ORDER — BUPIVACAINE HCL (PF) 0.25 % IJ SOLN
INTRAMUSCULAR | Status: AC
  Filled 2018-02-17: qty 30

## 2018-02-17 MED ORDER — DEXAMETHASONE SODIUM PHOSPHATE 10 MG/ML IJ SOLN: 10.0000 mg | Freq: Once | INTRAMUSCULAR | Status: DC

## 2018-02-17 MED ORDER — LACTATED RINGERS IV SOLN
INTRAVENOUS | Status: DC
  Administered 2018-02-17 (×2): via INTRAVENOUS

## 2018-02-17 MED ORDER — LIDOCAINE-EPINEPHRINE 2 %-1:100000 IJ SOLN
INTRAMUSCULAR | Status: AC
  Filled 2018-02-17: qty 1

## 2018-02-17 MED ORDER — ONDANSETRON HCL 4 MG/2ML IJ SOLN
INTRAMUSCULAR | Status: AC
  Filled 2018-02-17: qty 2

## 2018-02-17 MED ORDER — BUPIVACAINE-EPINEPHRINE (PF) 0.5% -1:200000 IJ SOLN
INTRAMUSCULAR | Status: AC
  Filled 2018-02-17: qty 7.2

## 2018-02-17 MED ORDER — FENTANYL CITRATE (PF) 100 MCG/2ML IJ SOLN
INTRAMUSCULAR | Status: AC
  Filled 2018-02-17: qty 2

## 2018-02-17 MED ORDER — LIDOCAINE 2% (20 MG/ML) 5 ML SYRINGE
INTRAMUSCULAR | Status: DC | PRN
  Administered 2018-02-17: 60 mg via INTRAVENOUS

## 2018-02-17 MED ORDER — MIDAZOLAM HCL 2 MG/2ML IJ SOLN: 1.0000 mg | INTRAMUSCULAR | Status: DC | PRN

## 2018-02-17 MED ORDER — LIDOCAINE 2% (20 MG/ML) 5 ML SYRINGE
INTRAMUSCULAR | Status: AC
  Filled 2018-02-17: qty 5

## 2018-02-17 MED ORDER — BACITRACIN-POLYMYXIN B 500-10000 UNIT/GM OP OINT
TOPICAL_OINTMENT | OPHTHALMIC | Status: DC | PRN
  Administered 2018-02-17: 1 via OPHTHALMIC

## 2018-02-17 MED ORDER — SCOPOLAMINE 1 MG/3DAYS TD PT72: 1.0000 | MEDICATED_PATCH | Freq: Once | TRANSDERMAL | Status: DC | PRN

## 2018-02-17 MED ORDER — FENTANYL CITRATE (PF) 100 MCG/2ML IJ SOLN
INTRAMUSCULAR | Status: AC
  Filled 2018-02-17: qty 4

## 2018-02-17 MED ORDER — NEOMYCIN-POLYMYXIN-DEXAMETH 3.5-10000-0.1 OP OINT
TOPICAL_OINTMENT | OPHTHALMIC | Status: AC
  Filled 2018-02-17: qty 3.5

## 2018-02-17 MED ORDER — LIDOCAINE-EPINEPHRINE 2 %-1:100000 IJ SOLN
INTRAMUSCULAR | Status: AC
  Filled 2018-02-17: qty 13.6

## 2018-02-17 MED ORDER — DEXAMETHASONE SODIUM PHOSPHATE 10 MG/ML IJ SOLN
INTRAMUSCULAR | Status: DC | PRN
  Administered 2018-02-17: 10 mg via INTRAVENOUS

## 2018-02-17 MED ORDER — DEXMEDETOMIDINE HCL IN NACL 200 MCG/50ML IV SOLN
INTRAVENOUS | Status: AC
  Filled 2018-02-17: qty 100

## 2018-02-17 MED ORDER — BSS IO SOLN
INTRAOCULAR | Status: AC
  Filled 2018-02-17: qty 15

## 2018-02-17 MED ORDER — PHENYLEPHRINE 40 MCG/ML (10ML) SYRINGE FOR IV PUSH (FOR BLOOD PRESSURE SUPPORT)
PREFILLED_SYRINGE | INTRAVENOUS | Status: DC | PRN
  Administered 2018-02-17: 80 ug via INTRAVENOUS

## 2018-02-17 MED ORDER — MIDAZOLAM HCL 5 MG/5ML IJ SOLN
INTRAMUSCULAR | Status: DC | PRN
  Administered 2018-02-17: 2 mg via INTRAVENOUS

## 2018-02-17 MED ORDER — SUGAMMADEX SODIUM 500 MG/5ML IV SOLN
INTRAVENOUS | Status: AC
  Filled 2018-02-17: qty 5

## 2018-02-17 MED ORDER — ARTIFICIAL TEARS OPHTHALMIC OINT
TOPICAL_OINTMENT | OPHTHALMIC | Status: DC | PRN
  Administered 2018-02-17 (×2): 1 via OPHTHALMIC

## 2018-02-17 MED ORDER — BACITRACIN ZINC 500 UNIT/GM EX OINT
TOPICAL_OINTMENT | CUTANEOUS | Status: AC
  Filled 2018-02-17: qty 2.7

## 2018-02-17 MED ORDER — PHENYLEPHRINE 40 MCG/ML (10ML) SYRINGE FOR IV PUSH (FOR BLOOD PRESSURE SUPPORT)
PREFILLED_SYRINGE | INTRAVENOUS | Status: AC
  Filled 2018-02-17: qty 10

## 2018-02-17 MED ORDER — ARTIFICIAL TEARS OPHTHALMIC OINT
TOPICAL_OINTMENT | OPHTHALMIC | Status: AC
  Filled 2018-02-17: qty 3.5

## 2018-02-17 MED ORDER — CEFAZOLIN SODIUM 1 G IJ SOLR
INTRAMUSCULAR | Status: AC
  Filled 2018-02-17: qty 20

## 2018-02-17 MED ORDER — PROPOFOL 10 MG/ML IV BOLUS
INTRAVENOUS | Status: DC | PRN
  Administered 2018-02-17: 200 mg via INTRAVENOUS

## 2018-02-17 MED ORDER — FENTANYL CITRATE (PF) 100 MCG/2ML IJ SOLN: 50.0000 ug | INTRAMUSCULAR | Status: DC | PRN

## 2018-02-17 MED ORDER — CEFAZOLIN SODIUM-DEXTROSE 2-4 GM/100ML-% IV SOLN
2.0000 g | INTRAVENOUS | Status: AC
  Administered 2018-02-17: 2 g via INTRAVENOUS

## 2018-02-17 MED ORDER — MIDAZOLAM HCL 2 MG/2ML IJ SOLN
INTRAMUSCULAR | Status: AC
  Filled 2018-02-17: qty 2

## 2018-02-17 MED ORDER — LIDOCAINE-EPINEPHRINE 2 %-1:100000 IJ SOLN
INTRAMUSCULAR | Status: DC | PRN
  Administered 2018-02-17: 6.5 mL

## 2018-02-17 MED ORDER — PROMETHAZINE HCL 25 MG/ML IJ SOLN: 6.2500 mg | INTRAMUSCULAR | Status: DC | PRN

## 2018-02-17 MED ORDER — HYDROMORPHONE HCL 1 MG/ML IJ SOLN
INTRAMUSCULAR | Status: AC
  Filled 2018-02-17: qty 0.5

## 2018-02-17 MED ORDER — SUGAMMADEX SODIUM 200 MG/2ML IV SOLN
INTRAVENOUS | Status: DC | PRN
  Administered 2018-02-17: 150 mg via INTRAVENOUS

## 2018-02-17 MED ORDER — HYDROMORPHONE HCL 2 MG PO TABS
ORAL_TABLET | ORAL | Status: AC
  Filled 2018-02-17: qty 1

## 2018-02-17 SURGICAL SUPPLY — 59 items
APPLICATOR DR MATTHEWS STRL (MISCELLANEOUS) IMPLANT
ATTRACTOMAT 16X20 MAGNETIC DRP (DRAPES) ×3 IMPLANT
BIT DRILL 13 TWIST JC NONSTRL (BIT) ×3 IMPLANT
BIT DRILL TWIST 1.3X5 (BIT) ×1
BIT DRILL TWIST 1.3X5MM (BIT) ×1 IMPLANT
BLADE SURG 15 STRL LF DISP TIS (BLADE) ×2 IMPLANT
BLADE SURG 15 STRL SS (BLADE) ×4
BUR CROSS CUT FISSURE 1.6 (BURR) ×2 IMPLANT
BUR CROSS CUT FISSURE 1.6MM (BURR) ×1
CANISTER SUCT 1200ML W/VALVE (MISCELLANEOUS) ×3 IMPLANT
COVER BACK TABLE 60X90IN (DRAPES) ×3 IMPLANT
COVER MAYO STAND STRL (DRAPES) ×3 IMPLANT
COVER WAND RF STERILE (DRAPES) IMPLANT
DRAPE EENT ADH APERT 31X51 STR (DRAPES) IMPLANT
DRAPE U-SHAPE 76X120 STRL (DRAPES) ×3 IMPLANT
DRILL BIT TWIST 1.3X5MM (BIT) ×2
DRSG TELFA 3X8 NADH (GAUZE/BANDAGES/DRESSINGS) IMPLANT
ELECT NEEDLE TIP 2.8 STRL (NEEDLE) ×3 IMPLANT
ELECT REM PT RETURN 9FT ADLT (ELECTROSURGICAL) ×3
ELECTRODE REM PT RTRN 9FT ADLT (ELECTROSURGICAL) ×1 IMPLANT
GAUZE 4X4 16PLY RFD (DISPOSABLE) IMPLANT
GLOVE BIO SURGEON STRL SZ 6.5 (GLOVE) ×4 IMPLANT
GLOVE BIO SURGEONS STRL SZ 6.5 (GLOVE) ×2
GLOVE BIOGEL PI IND STRL 7.0 (GLOVE) ×1 IMPLANT
GLOVE BIOGEL PI INDICATOR 7.0 (GLOVE) ×2
GLOVE ORTHO TXT STRL SZ7.5 (GLOVE) ×6 IMPLANT
GLOVE SURG SS PI 6.5 STRL IVOR (GLOVE) ×6 IMPLANT
GOWN STRL REUS W/ TWL LRG LVL3 (GOWN DISPOSABLE) ×3 IMPLANT
GOWN STRL REUS W/TWL LRG LVL3 (GOWN DISPOSABLE) ×6
NEEDLE BLUNT 17GA (NEEDLE) ×3 IMPLANT
NEEDLE DENTAL 27 LONG (NEEDLE) ×3 IMPLANT
NS IRRIG 1000ML POUR BTL (IV SOLUTION) ×3 IMPLANT
PACK BASIN DAY SURGERY FS (CUSTOM PROCEDURE TRAY) ×3 IMPLANT
PENCIL BUTTON HOLSTER BLD 10FT (ELECTRODE) ×3 IMPLANT
PLATE DELTA 1.7 25MM ORBITAL (Plate) ×3 IMPLANT
PLATE MID FACE 5H T (Plate) ×3 IMPLANT
PLATE UPPER FACE .6X10H CURV (Plate) ×1 IMPLANT
PLATE UPPER FACE 10H CURVED (Plate) ×2 IMPLANT
SCREW CARROL GIARRAD (Screw) ×3 IMPLANT
SCREW MIDFACE 1.7X4 SLF DRILL (Screw) ×12 IMPLANT
SCREW UPPERFACE1.2X3M SLFDRILL (Screw) ×12 IMPLANT
SHEILD EYE MED CORNL SHD 22X21 (OPHTHALMIC RELATED) ×3
SHIELD EYE MED CORNL SHD 22X21 (OPHTHALMIC RELATED) ×1 IMPLANT
SLEEVE SCD COMPRESS KNEE MED (MISCELLANEOUS) ×3 IMPLANT
STAPLER VISISTAT 35W (STAPLE) ×3 IMPLANT
SUT BONE WAX W31G (SUTURE) IMPLANT
SUT CHROMIC 3 0 PS 2 (SUTURE) ×3 IMPLANT
SUT NOVAFIL 5 0 BLK 18 IN P13 (SUTURE) IMPLANT
SUT PLAIN GUT FAST 5-0 (SUTURE) ×3 IMPLANT
SUT PROLENE 5 0 P 3 (SUTURE) ×3 IMPLANT
SUT SILK 4 0 PS 2 (SUTURE) ×3 IMPLANT
SUT VICRYL 4-0 PS2 18IN ABS (SUTURE) IMPLANT
SYR 50ML LL SCALE MARK (SYRINGE) ×6 IMPLANT
SYR CONTROL 10ML LL (SYRINGE) ×3 IMPLANT
TOWEL GREEN STERILE FF (TOWEL DISPOSABLE) ×3 IMPLANT
TOWEL OR NON WOVEN STRL DISP B (DISPOSABLE) IMPLANT
TUBE CONNECTING 20'X1/4 (TUBING) ×1
TUBE CONNECTING 20X1/4 (TUBING) ×2 IMPLANT
YANKAUER SUCT BULB TIP NO VENT (SUCTIONS) ×3 IMPLANT

## 2018-02-17 NOTE — Discharge Instructions (Addendum)
1. Regular mechanical soft diet until further notice. 2. Sinus precautions to include no nose blowing, no smoking, no straws, and open mouth sneezes. 3. OTC decongestant prn nasal congestion. 4. Ophthalmic bacitracin ointment into left lower eye lid twice daily for 5 days. 5. Bacitracin ointment to left cheek wound twice daily for 5 days. 6. No contact sports/activities until further notice.    Post Anesthesia Home Care Instructions  Activity: Get plenty of rest for the remainder of the day. A responsible individual must stay with you for 24 hours following the procedure.  For the next 24 hours, DO NOT: -Drive a car -Advertising copywriterperate machinery -Drink alcoholic beverages -Take any medication unless instructed by your physician -Make any legal decisions or sign important papers.  Meals: Start with liquid foods such as gelatin or soup. Progress to regular foods as tolerated. Avoid greasy, spicy, heavy foods. If nausea and/or vomiting occur, drink only clear liquids until the nausea and/or vomiting subsides. Call your physician if vomiting continues.  Special Instructions/Symptoms: Your throat may feel dry or sore from the anesthesia or the breathing tube placed in your throat during surgery. If this causes discomfort, gargle with warm salt water. The discomfort should disappear within 24 hours.  If you had a scopolamine patch placed behind your ear for the management of post- operative nausea and/or vomiting:  1. The medication in the patch is effective for 72 hours, after which it should be removed.  Wrap patch in a tissue and discard in the trash. Wash hands thoroughly with soap and water. 2. You may remove the patch earlier than 72 hours if you experience unpleasant side effects which may include dry mouth, dizziness or visual disturbances. 3. Avoid touching the patch. Wash your hands with soap and water after contact with the patch.

## 2018-02-17 NOTE — Anesthesia Postprocedure Evaluation (Signed)
Anesthesia Post Note  Patient: Allen Diaz  Procedure(s) Performed: OPEN REDUCTION INTERNAL FIXATION (ORIF) LEFT ORBITAL ZYGOMATIC FRACTURES (Left Face)     Patient location during evaluation: PACU Anesthesia Type: General Level of consciousness: awake and alert Pain management: pain level controlled Vital Signs Assessment: post-procedure vital signs reviewed and stable Respiratory status: spontaneous breathing, nonlabored ventilation, respiratory function stable and patient connected to nasal cannula oxygen Cardiovascular status: blood pressure returned to baseline and stable Postop Assessment: no apparent nausea or vomiting Anesthetic complications: no    Last Vitals:  Vitals:   02/17/18 1457 02/17/18 1515  BP:  (P) 113/67  Pulse: 73 (P) 65  Resp: 13 (P) 16  Temp:  (P) 37.2 C  SpO2: 98% (P) 100%    Last Pain:  Vitals:   02/17/18 1515  TempSrc:   PainSc: (P) 4                  Kennieth RadFitzgerald, Jarrod Mcenery E

## 2018-02-17 NOTE — Interval H&P Note (Signed)
History and Physical Interval Note:  02/17/2018 10:46 AM  Allen Diaz  has presented today for surgery, with the diagnosis of LEFT ZYGOMATIC FRACTURES, OPEN, DISPLACED  The various methods of treatment have been discussed with the patient and family. After consideration of risks, benefits and other options for treatment, the patient has consented to  Procedure(s): OPEN REDUCTION INTERNAL FIXATION (ORIF) LEFT ORBITAL ZYGOMATIC FRACTURES (Left) as a surgical intervention .  The patient's history has been reviewed, patient examined, no change in status, stable for surgery.  I have reviewed the patient's chart and labs.  Questions were answered to the patient's satisfaction.     Vivia EwingJustin Iver Miklas, DMD Oral and Maxillofacial Surgery
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
## 2018-02-17 NOTE — Transfer of Care (Signed)
Immediate Anesthesia Transfer of Care Note  Patient: Allen Diaz  Procedure(s) Performed: OPEN REDUCTION INTERNAL FIXATION (ORIF) LEFT ORBITAL ZYGOMATIC FRACTURES (Left Face)  Patient Location: PACU  Anesthesia Type:General  Level of Consciousness: drowsy and patient cooperative  Airway & Oxygen Therapy: Patient Spontanous Breathing and Patient connected to face mask oxygen  Post-op Assessment: Report given to RN and Post -op Vital signs reviewed and stable  Post vital signs: Reviewed and stable  Last Vitals:  Vitals Value Taken Time  BP 112/57 02/17/2018  1:45 PM  Temp 38 C 02/17/2018  1:45 PM  Pulse 87 02/17/2018  1:50 PM  Resp 15 02/17/2018  1:50 PM  SpO2 98 % 02/17/2018  1:50 PM  Vitals shown include unvalidated device data.  Last Pain:  Vitals:   02/17/18 0901  TempSrc: Oral  PainSc: 0-No pain      Patients Stated Pain Goal: 0 (02/17/18 0901)  Complications: No apparent anesthesia complications

## 2018-02-17 NOTE — Brief Op Note (Signed)
02/17/2018  1:37 PM  PATIENT:  Camdon J Gibson-Harris  22 y.o. male  PRE-OPERATIVE DIAGNOSIS:  LEFT ZYGOMATIC FRACTURES, OPEN, DISPLACED  POST-OPERATIVE DIAGNOSIS:  LEFT ZYGOMATIC FRACTURES, OPEN, DISPLACED  PROCEDURE:  Procedure(s): OPEN REDUCTION INTERNAL FIXATION (ORIF) LEFT ORBITAL ZYGOMATIC FRACTURES (Left)  SURGEON:  Surgeon(s) and Role:    * Robecca Fulgham, Jill AlexandersJustin, DMD - Primary  ASSISTANTS: Hadassah Paisae Carter, Meade MawLindsay Hargett  ANESTHESIA:   general  EBL:  10 mL   BLOOD ADMINISTERED:none  DRAINS: none   LOCAL MEDICATIONS USED:  XYLOCAINE   SPECIMEN:  No Specimen  DISPOSITION OF SPECIMEN:  N/A  COUNTS:  YES  TOURNIQUET:  * No tourniquets in log *  DICTATION: .Dragon Dictation  PLAN OF CARE: Discharge to home after PACU  PATIENT DISPOSITION:  PACU - hemodynamically stable.   Delay start of Pharmacological VTE agent (>24hrs) due to surgical blood loss or risk of bleeding: not applicable

## 2018-02-17 NOTE — Interval H&P Note (Signed)
History and Physical Interval Note:  02/17/2018 10:45 AM  Allen Diaz  has presented today for surgery, with the diagnosis of LEFT ZYGOMATIC FRACTURES, OPEN, DISPLACED  The various methods of treatment have been discussed with the patient and family. After consideration of risks, benefits and other options for treatment, the patient has consented to  Procedure(s): OPEN REDUCTION INTERNAL FIXATION (ORIF) LEFT ORBITAL ZYGOMATIC FRACTURES (Left) as a surgical intervention .  The patient's history has been reviewed, patient examined, no change in status, stable for surgery.  I have reviewed the patient's chart and labs.  Questions were answered to the patient's satisfaction.     Jill AlexandersJustin Indonesia Mckeough,DMD Oral and Maxillofacial Surgery

## 2018-02-17 NOTE — Anesthesia Procedure Notes (Signed)
Procedure Name: Intubation Date/Time: 02/17/2018 11:00 AM Performed by: Marny Lowensteinapozzi, Benuel Ly W, CRNA Pre-anesthesia Checklist: Patient identified, Emergency Drugs available, Suction available and Patient being monitored Patient Re-evaluated:Patient Re-evaluated prior to induction Oxygen Delivery Method: Circle System Utilized Preoxygenation: Pre-oxygenation with 100% oxygen Induction Type: IV induction Ventilation: Mask ventilation without difficulty Laryngoscope Size: Miller and 3 Grade View: Grade I Tube type: Oral Rae Tube size: 8.0 mm Number of attempts: 1 Airway Equipment and Method: Stylet and Oral airway Placement Confirmation: ETT inserted through vocal cords under direct vision,  positive ETCO2 and breath sounds checked- equal and bilateral Secured at: 22 cm Tube secured with: Tape Dental Injury: Teeth and Oropharynx as per pre-operative assessment

## 2018-02-19 ENCOUNTER — Encounter (HOSPITAL_BASED_OUTPATIENT_CLINIC_OR_DEPARTMENT_OTHER): Payer: Self-pay | Admitting: Oral Surgery

## 2018-02-26 ENCOUNTER — Emergency Department (HOSPITAL_BASED_OUTPATIENT_CLINIC_OR_DEPARTMENT_OTHER)
Admission: EM | Admit: 2018-02-26 | Discharge: 2018-02-26 | Disposition: A | Discharge status: Home / Self Care | Payer: Medicaid Other | Attending: Emergency Medicine | Admitting: Emergency Medicine

## 2018-02-26 ENCOUNTER — Other Ambulatory Visit: Payer: Self-pay

## 2018-02-26 ENCOUNTER — Encounter (HOSPITAL_BASED_OUTPATIENT_CLINIC_OR_DEPARTMENT_OTHER): Payer: Self-pay | Admitting: Emergency Medicine

## 2018-02-26 DIAGNOSIS — Z79899 Other long term (current) drug therapy: Secondary | ICD-10-CM | POA: Insufficient documentation

## 2018-02-26 DIAGNOSIS — R69 Illness, unspecified: Secondary | ICD-10-CM

## 2018-02-26 DIAGNOSIS — F17210 Nicotine dependence, cigarettes, uncomplicated: Secondary | ICD-10-CM | POA: Insufficient documentation

## 2018-02-26 DIAGNOSIS — Z7689 Persons encountering health services in other specified circumstances: Secondary | ICD-10-CM

## 2018-02-26 DIAGNOSIS — J111 Influenza due to unidentified influenza virus with other respiratory manifestations: Secondary | ICD-10-CM | POA: Insufficient documentation

## 2018-02-26 DIAGNOSIS — J069 Acute upper respiratory infection, unspecified: Secondary | ICD-10-CM | POA: Diagnosis present

## 2018-02-26 DIAGNOSIS — Z202 Contact with and (suspected) exposure to infections with a predominantly sexual mode of transmission: Secondary | ICD-10-CM | POA: Insufficient documentation

## 2018-02-26 MED ORDER — OSELTAMIVIR PHOSPHATE 75 MG PO CAPS: 75.0000 mg | ORAL_CAPSULE | Freq: Two times a day (BID) | ORAL | 0 refills | Status: DC

## 2018-02-26 MED ORDER — CEFTRIAXONE SODIUM 250 MG IJ SOLR
250.0000 mg | Freq: Once | INTRAMUSCULAR | Status: AC
  Administered 2018-02-26: 250 mg via INTRAMUSCULAR
  Filled 2018-02-26: qty 250

## 2018-02-26 MED ORDER — AZITHROMYCIN 1 G PO PACK
1.0000 g | PACK | Freq: Once | ORAL | Status: AC
  Administered 2018-02-26: 1 g via ORAL
  Filled 2018-02-26: qty 1
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
## 2018-02-26 NOTE — ED Triage Notes (Signed)
Pt states he is having generalized body ache, cold, chills and fatigue for the past 3 days.
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
## 2018-02-26 NOTE — Discharge Instructions (Addendum)
Based on the known exposure to influenza, I suspect this is the viral infection causing your upper respiratory symptoms.  As your symptoms are worsened for the last 2 days, please start taking Tamiflu.  Please push hydration and stay hydrated.  Please follow-up with your primary doctor in several days.  If any symptoms change or worsen, is return to the nearest emergency department.  You were treated with antibiotics for possible STD exposure.  Please follow-up with your primary doctor for further results.  Although he did not want it, we recommended getting HIV testing and other blood testing.  If you change your mind, is return to the nearest emergency department for this.

## 2018-02-26 NOTE — ED Notes (Signed)
C/o cough, congestion  Chills, fever generalized body aches x 3-4 days

## 2018-02-27 LAB — GC/CHLAMYDIA PROBE AMP (~~LOC~~) NOT AT ARMC
Chlamydia: NEGATIVE
Neisseria Gonorrhea: NEGATIVE

## 2018-03-22 ENCOUNTER — Other Ambulatory Visit: Payer: Self-pay

## 2018-03-22 ENCOUNTER — Encounter (HOSPITAL_BASED_OUTPATIENT_CLINIC_OR_DEPARTMENT_OTHER): Payer: Self-pay | Admitting: *Deleted

## 2018-03-22 ENCOUNTER — Emergency Department (HOSPITAL_BASED_OUTPATIENT_CLINIC_OR_DEPARTMENT_OTHER)
Admission: EM | Admit: 2018-03-22 | Discharge: 2018-03-23 | Disposition: A | Discharge status: Home / Self Care | Payer: Managed Care, Other (non HMO) | Attending: Emergency Medicine | Admitting: Emergency Medicine

## 2018-03-22 DIAGNOSIS — F17210 Nicotine dependence, cigarettes, uncomplicated: Secondary | ICD-10-CM | POA: Insufficient documentation

## 2018-03-22 DIAGNOSIS — Z202 Contact with and (suspected) exposure to infections with a predominantly sexual mode of transmission: Secondary | ICD-10-CM | POA: Diagnosis present

## 2018-03-22 DIAGNOSIS — N341 Nonspecific urethritis: Secondary | ICD-10-CM | POA: Insufficient documentation

## 2018-03-22 DIAGNOSIS — N342 Other urethritis: Secondary | ICD-10-CM

## 2018-03-22 LAB — URINALYSIS, ROUTINE W REFLEX MICROSCOPIC
Bilirubin Urine: NEGATIVE
Glucose, UA: NEGATIVE mg/dL
Hgb urine dipstick: NEGATIVE
Ketones, ur: NEGATIVE mg/dL
Leukocytes, UA: NEGATIVE
Nitrite: NEGATIVE
Protein, ur: NEGATIVE mg/dL
Specific Gravity, Urine: 1.01 (ref 1.005–1.030)
pH: 8.5 — ABNORMAL HIGH (ref 5.0–8.0)
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
## 2018-03-23 ENCOUNTER — Encounter (HOSPITAL_BASED_OUTPATIENT_CLINIC_OR_DEPARTMENT_OTHER): Payer: Self-pay | Admitting: Emergency Medicine

## 2018-03-23 MED ORDER — AZITHROMYCIN 1 G PO PACK
1.0000 g | PACK | Freq: Once | ORAL | Status: AC
  Administered 2018-03-23: 1 g via ORAL
  Filled 2018-03-23: qty 1

## 2018-03-23 MED ORDER — CEFTRIAXONE SODIUM 250 MG IJ SOLR
250.0000 mg | Freq: Once | INTRAMUSCULAR | Status: AC
  Administered 2018-03-23: 250 mg via INTRAMUSCULAR
  Filled 2018-03-23: qty 250
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
## 2018-03-24 LAB — GC/CHLAMYDIA PROBE AMP (~~LOC~~) NOT AT ARMC
Chlamydia: NEGATIVE
Neisseria Gonorrhea: NEGATIVE

## 2018-09-26 ENCOUNTER — Other Ambulatory Visit: Payer: Self-pay

## 2018-09-26 ENCOUNTER — Emergency Department (HOSPITAL_BASED_OUTPATIENT_CLINIC_OR_DEPARTMENT_OTHER)
Admission: EM | Admit: 2018-09-26 | Discharge: 2018-09-26 | Disposition: A | Discharge status: Home / Self Care | Payer: Managed Care, Other (non HMO) | Attending: Emergency Medicine | Admitting: Emergency Medicine

## 2018-09-26 ENCOUNTER — Encounter (HOSPITAL_BASED_OUTPATIENT_CLINIC_OR_DEPARTMENT_OTHER): Payer: Self-pay | Admitting: Emergency Medicine

## 2018-09-26 DIAGNOSIS — Z113 Encounter for screening for infections with a predominantly sexual mode of transmission: Secondary | ICD-10-CM | POA: Diagnosis present

## 2018-09-26 DIAGNOSIS — Z139 Encounter for screening, unspecified: Secondary | ICD-10-CM | POA: Diagnosis not present

## 2018-09-26 DIAGNOSIS — F17210 Nicotine dependence, cigarettes, uncomplicated: Secondary | ICD-10-CM | POA: Diagnosis not present

## 2018-09-26 NOTE — ED Triage Notes (Signed)
Patient states that he has had brown d/c about 2 weeks ago. He also states that he didn't have that much to do today so he thought he would come in today. No  S/s today
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
## 2018-09-26 NOTE — Discharge Instructions (Addendum)
Go to the health department for STD testing °

## 2019-03-31 ENCOUNTER — Encounter (HOSPITAL_BASED_OUTPATIENT_CLINIC_OR_DEPARTMENT_OTHER): Payer: Self-pay | Admitting: *Deleted

## 2019-03-31 ENCOUNTER — Other Ambulatory Visit: Payer: Self-pay

## 2019-03-31 ENCOUNTER — Emergency Department (HOSPITAL_BASED_OUTPATIENT_CLINIC_OR_DEPARTMENT_OTHER)
Admission: EM | Admit: 2019-03-31 | Discharge: 2019-03-31 | Disposition: A | Discharge status: Home / Self Care | Payer: Medicaid Other | Attending: Emergency Medicine | Admitting: Emergency Medicine

## 2019-03-31 DIAGNOSIS — N341 Nonspecific urethritis: Secondary | ICD-10-CM | POA: Diagnosis not present

## 2019-03-31 DIAGNOSIS — Z711 Person with feared health complaint in whom no diagnosis is made: Secondary | ICD-10-CM

## 2019-03-31 DIAGNOSIS — F17210 Nicotine dependence, cigarettes, uncomplicated: Secondary | ICD-10-CM | POA: Diagnosis not present

## 2019-03-31 DIAGNOSIS — N342 Other urethritis: Secondary | ICD-10-CM

## 2019-03-31 DIAGNOSIS — R360 Urethral discharge without blood: Secondary | ICD-10-CM | POA: Diagnosis present

## 2019-03-31 LAB — URINALYSIS, MICROSCOPIC (REFLEX)

## 2019-03-31 LAB — URINALYSIS, ROUTINE W REFLEX MICROSCOPIC
Bilirubin Urine: NEGATIVE
Glucose, UA: NEGATIVE mg/dL
Hgb urine dipstick: NEGATIVE
Ketones, ur: NEGATIVE mg/dL
Nitrite: NEGATIVE
Protein, ur: NEGATIVE mg/dL
Specific Gravity, Urine: 1.02 (ref 1.005–1.030)
pH: 8 (ref 5.0–8.0)

## 2019-03-31 LAB — HIV ANTIBODY (ROUTINE TESTING W REFLEX): HIV Screen 4th Generation wRfx: NONREACTIVE

## 2019-03-31 MED ORDER — DOXYCYCLINE HYCLATE 100 MG PO CAPS: 100.0000 mg | ORAL_CAPSULE | Freq: Two times a day (BID) | ORAL | 0 refills | Status: AC

## 2019-03-31 MED ORDER — CEFTRIAXONE SODIUM 500 MG IJ SOLR
500.0000 mg | Freq: Once | INTRAMUSCULAR | Status: AC
  Administered 2019-03-31: 500 mg via INTRAMUSCULAR
  Filled 2019-03-31: qty 500

## 2019-03-31 MED FILL — DOXYCYCLINE HYCLATE 100 MG: 100 | 7 days supply | Qty: 14 | Fill #0

## 2019-03-31 NOTE — ED Triage Notes (Signed)
Pt c/o penis discharge  " exposed to STD "
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
## 2019-03-31 NOTE — ED Notes (Signed)
Patient is A&Ox4 at this time.  Patient in no signs of distress.  Please see providers note for complete history and physical exam.  

## 2019-03-31 NOTE — Discharge Instructions (Signed)
Please take all of your antibiotics until finished!   Take your antibiotics with food.  Common side effects of antibiotics include nausea, vomiting, abdominal discomfort, and diarrhea. You may help offset some of this with probiotics which you can buy or get in yogurt. Do not eat  or take the probiotics until 2 hours after your antibiotic.    Follow up with [facility] Department STD clinic to be screened for HIV in the future and for future STD concerns or screenings. This is the recommendation by the CDC for people with multiple sexual partners or hx of STDs. You are being treated for gonorrhea and chlamydia but the hospital will call you if lab is positive.  Do not have sexual intercourse for 7 days after antibiotic treatment.

## 2019-04-01 LAB — GC/CHLAMYDIA PROBE AMP (~~LOC~~) NOT AT ARMC
Chlamydia: NEGATIVE
Neisseria Gonorrhea: POSITIVE — AB

## 2019-04-01 LAB — RPR: RPR Ser Ql: NONREACTIVE

## 2019-04-16 ENCOUNTER — Encounter (HOSPITAL_BASED_OUTPATIENT_CLINIC_OR_DEPARTMENT_OTHER): Payer: Self-pay | Admitting: *Deleted

## 2019-04-16 ENCOUNTER — Emergency Department (HOSPITAL_BASED_OUTPATIENT_CLINIC_OR_DEPARTMENT_OTHER)
Admission: EM | Admit: 2019-04-16 | Discharge: 2019-04-16 | Disposition: A | Discharge status: Home / Self Care | Payer: Medicaid Other | Attending: Emergency Medicine | Admitting: Emergency Medicine

## 2019-04-16 ENCOUNTER — Other Ambulatory Visit: Payer: Self-pay

## 2019-04-16 DIAGNOSIS — Z20822 Contact with and (suspected) exposure to covid-19: Secondary | ICD-10-CM | POA: Diagnosis not present

## 2019-04-16 DIAGNOSIS — F17210 Nicotine dependence, cigarettes, uncomplicated: Secondary | ICD-10-CM | POA: Insufficient documentation

## 2019-04-16 DIAGNOSIS — J029 Acute pharyngitis, unspecified: Secondary | ICD-10-CM

## 2019-04-16 LAB — GROUP A STREP BY PCR: Group A Strep by PCR: NOT DETECTED

## 2019-04-16 MED ORDER — IBUPROFEN 800 MG PO TABS
800.0000 mg | ORAL_TABLET | Freq: Once | ORAL | Status: AC
  Administered 2019-04-16: 19:00:00 800 mg via ORAL
  Filled 2019-04-16: qty 1

## 2019-04-16 NOTE — Discharge Instructions (Addendum)
Your Strep is negative.  You likely have a viral infection of your throat such as Covid.  Follow-up the Covid results.  Please stay home until your Covid results come back.  See your doctor   Return to ER if you have worse sore throat, trouble breathing, fever      Person Under Monitoring Name: Allen Diaz  Location: [address] [place] [address]   Infection Prevention Recommendations for Individuals Confirmed to have, or Being Evaluated for, 2019 Novel Coronavirus (COVID-19) Infection Who Receive Care at Home  Individuals who are confirmed to have, or are being evaluated for, COVID-19 should follow the prevention steps below until a healthcare provider or local or state health department says they can return to normal activities.  Stay home except to get medical care You should restrict activities outside your home, except for getting medical care. Do not go to work, school, or public areas, and do not use public transportation or taxis.  Call ahead before visiting your doctor Before your medical appointment, call the healthcare provider and tell them that you have, or are being evaluated for, COVID-19 infection. This will help the healthcare providers office take steps to keep other people from getting infected. Ask your healthcare provider to call the local or state health department.  Monitor your symptoms Seek prompt medical attention if your illness is worsening (e.g., difficulty breathing). Before going to your medical appointment, call the healthcare provider and tell them that you have, or are being evaluated for, COVID-19 infection. Ask your healthcare provider to call the local or state health department.  Wear a facemask You should wear a facemask that covers your nose and mouth when you are in the same room with other people and when you visit a healthcare provider. People who live with or visit you should also wear a facemask while they are  in the same room with you.  Separate yourself from other people in your home As much as possible, you should stay in a different room from other people in your home. Also, you should use a separate bathroom, if available.  Avoid sharing household items You should not share dishes, drinking glasses, cups, eating utensils, towels, bedding, or other items with other people in your home. After using these items, you should wash them thoroughly with soap and water.  Cover your coughs and sneezes Cover your mouth and nose with a tissue when you cough or sneeze, or you can cough or sneeze into your sleeve. Throw used tissues in a lined trash can, and immediately wash your hands with soap and water for at least 20 seconds or use an alcohol-based hand rub.  Wash your [employer] your hands often and thoroughly with soap and water for at least 20 seconds. You can use an alcohol-based hand sanitizer if soap and water are not available and if your hands are not visibly dirty. Avoid touching your eyes, nose, and mouth with unwashed hands.   Prevention Steps for Caregivers and Household Members of Individuals Confirmed to have, or Being Evaluated for, COVID-19 Infection Being Cared for in the Home  If you live with, or provide care at home for, a person confirmed to have, or being evaluated for, COVID-19 infection please follow these guidelines to prevent infection:  Follow healthcare providers instructions Make sure that you understand and can help the patient follow any healthcare provider instructions for all care.  Provide for the patients basic needs You should help  the patient with basic needs in the home and provide support for getting groceries, prescriptions, and other personal needs.  Monitor the patients symptoms If they are getting sicker, call his or her medical provider and tell them that the patient has, or is being evaluated for, COVID-19 infection. This will help the  healthcare providers office take steps to keep other people from getting infected. Ask the healthcare provider to call the local or state health department.  Limit the number of people who have contact with the patient If possible, have only one caregiver for the patient. Other household members should stay in another home or place of residence. If this is not possible, they should stay in another room, or be separated from the patient as much as possible. Use a separate bathroom, if available. Restrict visitors who do not have an essential need to be in the home.  Keep older adults, very young children, and other sick people away from the patient Keep older adults, very young children, and those who have compromised immune systems or chronic health conditions away from the patient. This includes people with chronic heart, lung, or kidney conditions, diabetes, and cancer.  Ensure good ventilation Make sure that shared spaces in the home have good air flow, such as from an air conditioner or an opened window, weather permitting.  Wash your hands often Wash your hands often and thoroughly with soap and water for at least 20 seconds. You can use an alcohol based hand sanitizer if soap and water are not available and if your hands are not visibly dirty. Avoid touching your eyes, nose, and mouth with unwashed hands. Use disposable paper towels to dry your hands. If not available, use dedicated cloth towels and replace them when they become wet.  Wear a facemask and gloves Wear a disposable facemask at all times in the room and gloves when you touch or have contact with the patients blood, body fluids, and/or secretions or excretions, such as sweat, saliva, sputum, nasal mucus, vomit, urine, or feces.  Ensure the mask fits over your nose and mouth tightly, and do not touch it during use. Throw out disposable facemasks and gloves after using them. Do not reuse. Wash your hands immediately after  removing your facemask and gloves. If your personal clothing becomes contaminated, carefully remove clothing and launder. Wash your hands after handling contaminated clothing. Place all used disposable facemasks, gloves, and other waste in a lined container before disposing them with other household waste. Remove gloves and wash your hands immediately after handling these items.  Do not share dishes, glasses, or other household items with the patient Avoid sharing household items. You should not share dishes, drinking glasses, cups, eating utensils, towels, bedding, or other items with a patient who is confirmed to have, or being evaluated for, COVID-19 infection. After the person uses these items, you should wash them thoroughly with soap and water.  Wash laundry thoroughly Immediately remove and wash clothes or bedding that have blood, body fluids, and/or secretions or excretions, such as sweat, saliva, sputum, nasal mucus, vomit, urine, or feces, on them. Wear gloves when handling laundry from the patient. Read and follow directions on labels of laundry or clothing items and detergent. In general, wash and dry with the warmest temperatures recommended on the label.  Clean all areas the individual has used often Clean all touchable surfaces, such as counters, tabletops, doorknobs, bathroom fixtures, toilets, phones, keyboards, tablets, and bedside tables, every day. Also, clean any surfaces  that may have blood, body fluids, and/or secretions or excretions on them. Wear gloves when cleaning surfaces the patient has come in contact with. Use a diluted bleach solution (e.g., dilute bleach with 1 part bleach and 10 parts water) or a household disinfectant with a label that says EPA-registered for coronaviruses. To make a bleach solution at home, add 1 tablespoon of bleach to 1 quart (4 cups) of water. For a larger supply, add  cup of bleach to 1 gallon (16 cups) of water. Read labels of cleaning  products and follow recommendations provided on product labels. Labels contain instructions for safe and effective use of the cleaning product including precautions you should take when applying the product, such as wearing gloves or eye protection and making sure you have good ventilation during use of the product. Remove gloves and wash hands immediately after cleaning.  Monitor yourself for signs and symptoms of illness Caregivers and household members are considered close contacts, should monitor their health, and will be asked to limit movement outside of the home to the extent possible. Follow the monitoring steps for close contacts listed on the symptom monitoring form.   ? If you have additional questions, contact your local health department or call the epidemiologist on call at [phone] (available 24/7). ? This guidance is subject to change. For the most up-to-date guidance from [facility], please refer to their website: [email or web address]hu

## 2019-04-16 NOTE — ED Triage Notes (Signed)
Sore throat. He had a Covid test today. Results pending.
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
## 2019-05-06 ENCOUNTER — Other Ambulatory Visit: Payer: Self-pay

## 2019-05-06 ENCOUNTER — Emergency Department (HOSPITAL_BASED_OUTPATIENT_CLINIC_OR_DEPARTMENT_OTHER)
Admission: EM | Admit: 2019-05-06 | Discharge: 2019-05-06 | Disposition: A | Discharge status: Home / Self Care | Payer: Medicaid Other | Attending: Emergency Medicine | Admitting: Emergency Medicine

## 2019-05-06 ENCOUNTER — Encounter (HOSPITAL_BASED_OUTPATIENT_CLINIC_OR_DEPARTMENT_OTHER): Payer: Self-pay | Admitting: Emergency Medicine

## 2019-05-06 DIAGNOSIS — F17210 Nicotine dependence, cigarettes, uncomplicated: Secondary | ICD-10-CM | POA: Diagnosis not present

## 2019-05-06 DIAGNOSIS — N341 Nonspecific urethritis: Secondary | ICD-10-CM | POA: Diagnosis not present

## 2019-05-06 DIAGNOSIS — R369 Urethral discharge, unspecified: Secondary | ICD-10-CM

## 2019-05-06 DIAGNOSIS — N342 Other urethritis: Secondary | ICD-10-CM

## 2019-05-06 DIAGNOSIS — R360 Urethral discharge without blood: Secondary | ICD-10-CM | POA: Diagnosis present

## 2019-05-06 MED ORDER — CEFTRIAXONE SODIUM 500 MG IJ SOLR
500.0000 mg | Freq: Once | INTRAMUSCULAR | Status: AC
  Administered 2019-05-06: 500 mg via INTRAMUSCULAR
  Filled 2019-05-06: qty 500

## 2019-05-06 MED ORDER — DOXYCYCLINE HYCLATE 100 MG PO CAPS: 100.0000 mg | ORAL_CAPSULE | Freq: Two times a day (BID) | ORAL | 0 refills | Status: DC

## 2019-05-06 MED ORDER — DOXYCYCLINE HYCLATE 100 MG PO TABS
100.0000 mg | ORAL_TABLET | Freq: Once | ORAL | Status: AC
  Administered 2019-05-06: 05:00:00 100 mg via ORAL
  Filled 2019-05-06: qty 1
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
## 2019-05-06 NOTE — Discharge Instructions (Signed)
Refrain from sexual intercourse for 2 weeks and have all partners evaluated and treated at the local health department.   

## 2019-05-06 NOTE — ED Triage Notes (Signed)
Patient arrived via POV c/o exposure to STD x 10 days. Patient states he was previously seen for STD, diagnosed with gonorrhea. Patient exposed with partner prior to completion of treatment. Presents with brown discharge and pain 6/10 when urinating. Patient is AO x 4, VS WDL, normal gait.

## 2019-05-19 ENCOUNTER — Emergency Department (HOSPITAL_BASED_OUTPATIENT_CLINIC_OR_DEPARTMENT_OTHER)
Admission: EM | Admit: 2019-05-19 | Discharge: 2019-05-19 | Disposition: A | Discharge status: Home / Self Care | Payer: Medicaid Other | Attending: Emergency Medicine | Admitting: Emergency Medicine

## 2019-05-19 ENCOUNTER — Other Ambulatory Visit: Payer: Self-pay

## 2019-05-19 ENCOUNTER — Encounter (HOSPITAL_BASED_OUTPATIENT_CLINIC_OR_DEPARTMENT_OTHER): Payer: Self-pay

## 2019-05-19 ENCOUNTER — Emergency Department (HOSPITAL_BASED_OUTPATIENT_CLINIC_OR_DEPARTMENT_OTHER): Payer: Medicaid Other

## 2019-05-19 DIAGNOSIS — S62314A Displaced fracture of base of fourth metacarpal bone, right hand, initial encounter for closed fracture: Secondary | ICD-10-CM | POA: Diagnosis not present

## 2019-05-19 DIAGNOSIS — Y929 Unspecified place or not applicable: Secondary | ICD-10-CM | POA: Insufficient documentation

## 2019-05-19 DIAGNOSIS — Y999 Unspecified external cause status: Secondary | ICD-10-CM | POA: Insufficient documentation

## 2019-05-19 DIAGNOSIS — F17210 Nicotine dependence, cigarettes, uncomplicated: Secondary | ICD-10-CM | POA: Insufficient documentation

## 2019-05-19 DIAGNOSIS — Y9389 Activity, other specified: Secondary | ICD-10-CM | POA: Insufficient documentation

## 2019-05-19 DIAGNOSIS — S62316A Displaced fracture of base of fifth metacarpal bone, right hand, initial encounter for closed fracture: Secondary | ICD-10-CM | POA: Insufficient documentation

## 2019-05-19 DIAGNOSIS — W2209XA Striking against other stationary object, initial encounter: Secondary | ICD-10-CM | POA: Diagnosis not present

## 2019-05-19 DIAGNOSIS — S6991XA Unspecified injury of right wrist, hand and finger(s), initial encounter: Secondary | ICD-10-CM | POA: Diagnosis present

## 2019-05-19 MED ORDER — IBUPROFEN 800 MG PO TABS
800.0000 mg | ORAL_TABLET | Freq: Once | ORAL | Status: AC
  Administered 2019-05-19: 800 mg via ORAL
  Filled 2019-05-19: qty 1

## 2019-05-19 MED ORDER — IBUPROFEN 600 MG PO TABS: 600.0000 mg | ORAL_TABLET | Freq: Four times a day (QID) | ORAL | 0 refills | Status: DC | PRN

## 2019-05-19 MED ORDER — HYDROCODONE-ACETAMINOPHEN 5-325 MG PO TABS: 1.0000 | ORAL_TABLET | ORAL | 0 refills | Status: DC | PRN

## 2019-05-19 NOTE — ED Notes (Signed)
ED Provider at bedside. 
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
## 2019-05-24 ENCOUNTER — Other Ambulatory Visit: Payer: Self-pay

## 2019-05-24 ENCOUNTER — Emergency Department (HOSPITAL_BASED_OUTPATIENT_CLINIC_OR_DEPARTMENT_OTHER)
Admission: EM | Admit: 2019-05-24 | Discharge: 2019-05-24 | Disposition: A | Discharge status: Home / Self Care | Payer: Medicaid Other | Attending: Emergency Medicine | Admitting: Emergency Medicine

## 2019-05-24 ENCOUNTER — Encounter (HOSPITAL_BASED_OUTPATIENT_CLINIC_OR_DEPARTMENT_OTHER): Payer: Self-pay | Admitting: Emergency Medicine

## 2019-05-24 DIAGNOSIS — S62316D Displaced fracture of base of fifth metacarpal bone, right hand, subsequent encounter for fracture with routine healing: Secondary | ICD-10-CM | POA: Diagnosis not present

## 2019-05-24 DIAGNOSIS — M79641 Pain in right hand: Secondary | ICD-10-CM | POA: Diagnosis present

## 2019-05-24 DIAGNOSIS — F17210 Nicotine dependence, cigarettes, uncomplicated: Secondary | ICD-10-CM | POA: Insufficient documentation

## 2019-05-24 DIAGNOSIS — S62314A Displaced fracture of base of fourth metacarpal bone, right hand, initial encounter for closed fracture: Secondary | ICD-10-CM

## 2019-05-24 DIAGNOSIS — X58XXXD Exposure to other specified factors, subsequent encounter: Secondary | ICD-10-CM | POA: Diagnosis not present

## 2019-05-24 DIAGNOSIS — S62316B Displaced fracture of base of fifth metacarpal bone, right hand, initial encounter for open fracture: Secondary | ICD-10-CM
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
## 2019-05-24 NOTE — Discharge Instructions (Addendum)
You need to keep splint in place until surgery.
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
## 2019-05-26 ENCOUNTER — Other Ambulatory Visit (HOSPITAL_COMMUNITY): Payer: Medicaid Other

## 2019-05-26 ENCOUNTER — Inpatient Hospital Stay (HOSPITAL_COMMUNITY): Admission: RE | Admit: 2019-05-26 | Payer: Medicaid Other | Source: Ambulatory Visit

## 2019-05-27 ENCOUNTER — Other Ambulatory Visit (HOSPITAL_COMMUNITY)
Admission: RE | Admit: 2019-05-27 | Discharge: 2019-05-27 | Disposition: A | Discharge status: Home / Self Care | Payer: Medicaid Other | Source: Ambulatory Visit | Attending: Orthopedic Surgery | Admitting: Orthopedic Surgery

## 2019-05-27 ENCOUNTER — Other Ambulatory Visit: Payer: Self-pay

## 2019-05-27 ENCOUNTER — Encounter (HOSPITAL_COMMUNITY): Payer: Self-pay | Admitting: Orthopedic Surgery

## 2019-05-27 DIAGNOSIS — Z01812 Encounter for preprocedural laboratory examination: Secondary | ICD-10-CM | POA: Insufficient documentation

## 2019-05-27 DIAGNOSIS — Z20822 Contact with and (suspected) exposure to covid-19: Secondary | ICD-10-CM | POA: Diagnosis not present

## 2019-05-27 LAB — SARS CORONAVIRUS 2 (TAT 6-24 HRS): SARS Coronavirus 2: NEGATIVE

## 2019-05-27 NOTE — Progress Notes (Addendum)
Mr Allen Diaz denies chest pain or shortness of breath. Patient had Covid test today and has been in quarantine with the people he  lives with.  Mr Allen Diaz had an cardiac ablation at age 24 for [facility] Disease.  Patient has not seen a cardiologist ain years,, patient does not have a PCP.  I asked PA-C or NP for anesthesia to review the chart.
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
## 2019-05-29 ENCOUNTER — Ambulatory Visit (HOSPITAL_COMMUNITY): Payer: Medicaid Other | Admitting: Physician Assistant

## 2019-05-29 ENCOUNTER — Ambulatory Visit (HOSPITAL_COMMUNITY)
Admission: RE | Admit: 2019-05-29 | Discharge: 2019-05-29 | Disposition: A | Discharge status: Home / Self Care | Payer: Medicaid Other | Source: Ambulatory Visit | Attending: Orthopedic Surgery | Admitting: Orthopedic Surgery

## 2019-05-29 ENCOUNTER — Encounter (HOSPITAL_COMMUNITY)
Admission: RE | Disposition: A | Discharge status: Home / Self Care | Payer: Self-pay | Source: Ambulatory Visit | Attending: Orthopedic Surgery

## 2019-05-29 ENCOUNTER — Encounter (HOSPITAL_COMMUNITY): Payer: Self-pay | Admitting: Orthopedic Surgery

## 2019-05-29 ENCOUNTER — Other Ambulatory Visit: Payer: Self-pay

## 2019-05-29 ENCOUNTER — Ambulatory Visit (HOSPITAL_COMMUNITY): Payer: Medicaid Other

## 2019-05-29 DIAGNOSIS — W228XXA Striking against or struck by other objects, initial encounter: Secondary | ICD-10-CM | POA: Insufficient documentation

## 2019-05-29 DIAGNOSIS — S62316B Displaced fracture of base of fifth metacarpal bone, right hand, initial encounter for open fracture: Secondary | ICD-10-CM

## 2019-05-29 DIAGNOSIS — S62316A Displaced fracture of base of fifth metacarpal bone, right hand, initial encounter for closed fracture: Secondary | ICD-10-CM | POA: Insufficient documentation

## 2019-05-29 DIAGNOSIS — S6991XA Unspecified injury of right wrist, hand and finger(s), initial encounter: Secondary | ICD-10-CM | POA: Diagnosis present

## 2019-05-29 DIAGNOSIS — Z87891 Personal history of nicotine dependence: Secondary | ICD-10-CM | POA: Diagnosis not present

## 2019-05-29 DIAGNOSIS — S62314A Displaced fracture of base of fourth metacarpal bone, right hand, initial encounter for closed fracture: Secondary | ICD-10-CM | POA: Insufficient documentation

## 2019-05-29 HISTORY — PX: CLOSED REDUCTION FINGER WITH PERCUTANEOUS PINNING: SHX5612

## 2019-05-29 SURGERY — CLOSED REDUCTION, FINGER, WITH PERCUTANEOUS PINNING
Anesthesia: General | Site: Finger | Laterality: Right

## 2019-05-29 MED ORDER — PROPOFOL 10 MG/ML IV BOLUS
INTRAVENOUS | Status: AC
  Filled 2019-05-29: qty 20

## 2019-05-29 MED ORDER — ONDANSETRON HCL 4 MG/2ML IJ SOLN
INTRAMUSCULAR | Status: AC
  Filled 2019-05-29: qty 2

## 2019-05-29 MED ORDER — FENTANYL CITRATE (PF) 100 MCG/2ML IJ SOLN
INTRAMUSCULAR | Status: DC | PRN
  Administered 2019-05-29 (×2): 50 ug via INTRAVENOUS

## 2019-05-29 MED ORDER — CHLORHEXIDINE GLUCONATE 4 % EX LIQD: 60.0000 mL | Freq: Once | CUTANEOUS | Status: DC

## 2019-05-29 MED ORDER — BUPIVACAINE HCL (PF) 0.25 % IJ SOLN
INTRAMUSCULAR | Status: AC
  Filled 2019-05-29: qty 30

## 2019-05-29 MED ORDER — LIDOCAINE 2% (20 MG/ML) 5 ML SYRINGE
INTRAMUSCULAR | Status: AC
  Filled 2019-05-29: qty 5

## 2019-05-29 MED ORDER — BUPIVACAINE HCL (PF) 0.25 % IJ SOLN
INTRAMUSCULAR | Status: DC | PRN
  Administered 2019-05-29: 7 mL

## 2019-05-29 MED ORDER — KETOROLAC TROMETHAMINE 30 MG/ML IJ SOLN: 30.0000 mg | Freq: Once | INTRAMUSCULAR | Status: DC

## 2019-05-29 MED ORDER — DEXAMETHASONE SODIUM PHOSPHATE 10 MG/ML IJ SOLN
INTRAMUSCULAR | Status: AC
  Filled 2019-05-29: qty 1

## 2019-05-29 MED ORDER — LACTATED RINGERS IV SOLN: INTRAVENOUS | Status: DC

## 2019-05-29 MED ORDER — FENTANYL CITRATE (PF) 250 MCG/5ML IJ SOLN
INTRAMUSCULAR | Status: AC
  Filled 2019-05-29: qty 5

## 2019-05-29 MED ORDER — CEFAZOLIN SODIUM-DEXTROSE 2-4 GM/100ML-% IV SOLN
2.0000 g | INTRAVENOUS | Status: AC
  Administered 2019-05-29: 2 g via INTRAVENOUS

## 2019-05-29 MED ORDER — MIDAZOLAM HCL 2 MG/2ML IJ SOLN
INTRAMUSCULAR | Status: AC
  Filled 2019-05-29: qty 2

## 2019-05-29 MED ORDER — PHENYLEPHRINE 40 MCG/ML (10ML) SYRINGE FOR IV PUSH (FOR BLOOD PRESSURE SUPPORT)
PREFILLED_SYRINGE | INTRAVENOUS | Status: DC | PRN
  Administered 2019-05-29 (×5): 80 ug via INTRAVENOUS

## 2019-05-29 MED ORDER — DEXAMETHASONE SODIUM PHOSPHATE 10 MG/ML IJ SOLN
INTRAMUSCULAR | Status: DC | PRN
  Administered 2019-05-29: 10 mg via INTRAVENOUS

## 2019-05-29 MED ORDER — MIDAZOLAM HCL 5 MG/5ML IJ SOLN
INTRAMUSCULAR | Status: DC | PRN
  Administered 2019-05-29: 2 mg via INTRAVENOUS

## 2019-05-29 MED ORDER — ACETAMINOPHEN 10 MG/ML IV SOLN: 1000.0000 mg | Freq: Once | INTRAVENOUS | Status: DC | PRN

## 2019-05-29 MED ORDER — LIDOCAINE HCL (CARDIAC) PF 100 MG/5ML IV SOSY
PREFILLED_SYRINGE | INTRAVENOUS | Status: DC | PRN
  Administered 2019-05-29: 60 mg via INTRAVENOUS

## 2019-05-29 MED ORDER — CEFAZOLIN SODIUM-DEXTROSE 2-4 GM/100ML-% IV SOLN
INTRAVENOUS | Status: AC
  Filled 2019-05-29: qty 100

## 2019-05-29 MED ORDER — OXYCODONE HCL 5 MG PO TABS: 5.0000 mg | ORAL_TABLET | Freq: Once | ORAL | Status: DC | PRN

## 2019-05-29 MED ORDER — ONDANSETRON HCL 4 MG/2ML IJ SOLN
INTRAMUSCULAR | Status: DC | PRN
  Administered 2019-05-29: 4 mg via INTRAVENOUS

## 2019-05-29 MED ORDER — PROPOFOL 10 MG/ML IV BOLUS
INTRAVENOUS | Status: DC | PRN
  Administered 2019-05-29: 200 mg via INTRAVENOUS
  Administered 2019-05-29: 30 mg via INTRAVENOUS

## 2019-05-29 MED ORDER — PROMETHAZINE HCL 25 MG/ML IJ SOLN: 6.2500 mg | INTRAMUSCULAR | Status: DC | PRN

## 2019-05-29 MED ORDER — HYDROMORPHONE HCL 1 MG/ML IJ SOLN: 0.2500 mg | INTRAMUSCULAR | Status: DC | PRN

## 2019-05-29 MED ORDER — OXYCODONE HCL 5 MG/5ML PO SOLN: 5.0000 mg | Freq: Once | ORAL | Status: DC | PRN

## 2019-05-29 MED ORDER — 0.9 % SODIUM CHLORIDE (POUR BTL) OPTIME
TOPICAL | Status: DC | PRN
  Administered 2019-05-29: 1000 mL

## 2019-05-29 SURGICAL SUPPLY — 37 items
BENZOIN TINCTURE PRP APPL 2/3 (GAUZE/BANDAGES/DRESSINGS) IMPLANT
BLADE CLIPPER SURG (BLADE) IMPLANT
BNDG ELASTIC 3X5.8 VLCR STR LF (GAUZE/BANDAGES/DRESSINGS) ×6 IMPLANT
BNDG ELASTIC 4X5.8 VLCR STR LF (GAUZE/BANDAGES/DRESSINGS) IMPLANT
BNDG GAUZE ELAST 4 BULKY (GAUZE/BANDAGES/DRESSINGS) ×3 IMPLANT
CLOSURE WOUND 1/2 X4 (GAUZE/BANDAGES/DRESSINGS)
CORD BIPOLAR FORCEPS 12FT (ELECTRODE) ×3 IMPLANT
COVER SURGICAL LIGHT HANDLE (MISCELLANEOUS) ×3 IMPLANT
COVER WAND RF STERILE (DRAPES) ×3 IMPLANT
CUFF TOURN SGL QUICK 18X4 (TOURNIQUET CUFF) IMPLANT
CUFF TOURN SGL QUICK 24 (TOURNIQUET CUFF)
CUFF TRNQT CYL 24X4X16.5-23 (TOURNIQUET CUFF) IMPLANT
DRAPE OEC MINIVIEW 54X84 (DRAPES) ×3 IMPLANT
DRSG EMULSION OIL 3X3 NADH (GAUZE/BANDAGES/DRESSINGS) IMPLANT
GAUZE SPONGE 4X4 12PLY STRL (GAUZE/BANDAGES/DRESSINGS) ×3 IMPLANT
GAUZE XEROFORM 1X8 LF (GAUZE/BANDAGES/DRESSINGS) ×6 IMPLANT
GLOVE BIOGEL PI IND STRL 8.5 (GLOVE) ×1 IMPLANT
GLOVE BIOGEL PI INDICATOR 8.5 (GLOVE) ×2
GLOVE SURG ORTHO 8.0 STRL STRW (GLOVE) ×3 IMPLANT
GOWN STRL REUS W/ TWL LRG LVL3 (GOWN DISPOSABLE) ×2 IMPLANT
GOWN STRL REUS W/TWL LRG LVL3 (GOWN DISPOSABLE) ×4
K-WIRE CAPS STERILE WHITE .045 (WIRE) ×9 IMPLANT
KIT BASIN OR (CUSTOM PROCEDURE TRAY) ×3 IMPLANT
KIT TURNOVER KIT B (KITS) ×3 IMPLANT
NS IRRIG 1000ML POUR BTL (IV SOLUTION) ×3 IMPLANT
PACK ORTHO EXTREMITY (CUSTOM PROCEDURE TRAY) ×3 IMPLANT
PAD ARMBOARD 7.5X6 YLW CONV (MISCELLANEOUS) ×6 IMPLANT
SOAP 2 % CHG 4 OZ (WOUND CARE) ×3 IMPLANT
STRIP CLOSURE SKIN 1/2X4 (GAUZE/BANDAGES/DRESSINGS) IMPLANT
SUCTION FRAZIER HANDLE 10FR (MISCELLANEOUS)
SUCTION TUBE FRAZIER 10FR DISP (MISCELLANEOUS) IMPLANT
SUT PROLENE 3 0 PS 2 (SUTURE) IMPLANT
SUT PROLENE 4 0 P 3 18 (SUTURE) IMPLANT
SUT VICRYL 4-0 PS2 18IN ABS (SUTURE) IMPLANT
TOWEL GREEN STERILE (TOWEL DISPOSABLE) ×3 IMPLANT
TOWEL GREEN STERILE FF (TOWEL DISPOSABLE) ×3 IMPLANT
WATER STERILE IRR 1000ML POUR (IV SOLUTION) ×3 IMPLANT

## 2019-05-29 NOTE — Anesthesia Procedure Notes (Signed)
Procedure Name: LMA Insertion Date/Time: 05/29/2019 9:31 AM Performed by: Lovie Chol, CRNA Pre-anesthesia Checklist: Patient identified, Emergency Drugs available, Suction available and Patient being monitored Patient Re-evaluated:Patient Re-evaluated prior to induction Oxygen Delivery Method: Circle System Utilized Preoxygenation: Pre-oxygenation with 100% oxygen Induction Type: IV induction Ventilation: Mask ventilation without difficulty LMA: LMA inserted LMA Size: 4.0 Number of attempts: 1 Airway Equipment and Method: Bite block Placement Confirmation: positive ETCO2 and breath sounds checked- equal and bilateral Tube secured with: Tape Dental Injury: Teeth and Oropharynx as per pre-operative assessment

## 2019-05-29 NOTE — Transfer of Care (Signed)
Immediate Anesthesia Transfer of Care Note  Patient: Allen Diaz  Procedure(s) Performed: CLOSED REDUCTION FINGER WITH PERCUTANEOUS PINNING (Right Finger)  Patient Location: PACU  Anesthesia Type:General  Level of Consciousness: oriented, drowsy and patient cooperative  Airway & Oxygen Therapy: Patient Spontanous Breathing and Patient connected to nasal cannula oxygen  Post-op Assessment: Report given to RN and Post -op Vital signs reviewed and stable  Post vital signs: Reviewed  Last Vitals:  Vitals Value Taken Time  BP    Temp    Pulse    Resp    SpO2      Last Pain:  Vitals:   05/29/19 0728  TempSrc: Oral  PainSc: 0-No pain      Patients Stated Pain Goal: 6 (05/29/19 0728)  Complications: No apparent anesthesia complications
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
## 2019-05-29 NOTE — Anesthesia Postprocedure Evaluation (Signed)
Anesthesia Post Note  Patient: Allen Diaz  Procedure(s) Performed: CLOSED REDUCTION FINGER WITH PERCUTANEOUS PINNING (Right Finger)     Patient location during evaluation: PACU Anesthesia Type: General Level of consciousness: awake and alert Pain management: pain level controlled Vital Signs Assessment: post-procedure vital signs reviewed and stable Respiratory status: spontaneous breathing, nonlabored ventilation, respiratory function stable and patient connected to nasal cannula oxygen Cardiovascular status: blood pressure returned to baseline and stable Postop Assessment: no apparent nausea or vomiting Anesthetic complications: no    Last Vitals:  Vitals:   05/29/19 1030 05/29/19 1048  BP: 112/78 113/83  Pulse: 62 73  Resp: 10 15  Temp: (!) 36.3 C (!) 36.1 C  SpO2: 100% 100%    Last Pain:  Vitals:   05/29/19 1030  TempSrc:   PainSc: Asleep                 Allena Pietila P Ellias Mcelreath

## 2019-05-29 NOTE — Discharge Instructions (Signed)
KEEP BANDAGE CLEAN AND DRY °CALL OFFICE FOR F/U APPT [phone] °KEEP HAND ELEVATED ABOVE HEART °OK TO APPLY ICE TO OPERATIVE AREA °CONTACT OFFICE IF ANY WORSENING PAIN OR CONCERNS. °

## 2019-06-03 ENCOUNTER — Encounter: Payer: Self-pay | Admitting: *Deleted

## 2019-06-05 ENCOUNTER — Encounter (HOSPITAL_BASED_OUTPATIENT_CLINIC_OR_DEPARTMENT_OTHER): Payer: Self-pay | Admitting: Emergency Medicine

## 2019-06-05 ENCOUNTER — Emergency Department (HOSPITAL_BASED_OUTPATIENT_CLINIC_OR_DEPARTMENT_OTHER)
Admission: EM | Admit: 2019-06-05 | Discharge: 2019-06-05 | Disposition: A | Discharge status: Home / Self Care | Payer: Medicaid Other | Attending: Emergency Medicine | Admitting: Emergency Medicine

## 2019-06-05 ENCOUNTER — Other Ambulatory Visit: Payer: Self-pay

## 2019-06-05 DIAGNOSIS — X58XXXD Exposure to other specified factors, subsequent encounter: Secondary | ICD-10-CM | POA: Insufficient documentation

## 2019-06-05 DIAGNOSIS — Z4789 Encounter for other orthopedic aftercare: Secondary | ICD-10-CM

## 2019-06-05 DIAGNOSIS — F17210 Nicotine dependence, cigarettes, uncomplicated: Secondary | ICD-10-CM | POA: Insufficient documentation

## 2019-06-05 DIAGNOSIS — S62316D Displaced fracture of base of fifth metacarpal bone, right hand, subsequent encounter for fracture with routine healing: Secondary | ICD-10-CM | POA: Diagnosis not present

## 2019-06-05 DIAGNOSIS — Z4689 Encounter for fitting and adjustment of other specified devices: Secondary | ICD-10-CM | POA: Diagnosis present

## 2019-06-05 NOTE — Discharge Instructions (Addendum)
Keep the cast clean and dry.  Follow-up with your orthopedist as directed.  Return here as needed for any worsening symptoms.
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
## 2019-06-10 ENCOUNTER — Other Ambulatory Visit: Payer: Self-pay

## 2019-06-10 ENCOUNTER — Encounter (HOSPITAL_BASED_OUTPATIENT_CLINIC_OR_DEPARTMENT_OTHER): Payer: Self-pay | Admitting: Emergency Medicine

## 2019-06-10 ENCOUNTER — Emergency Department (HOSPITAL_BASED_OUTPATIENT_CLINIC_OR_DEPARTMENT_OTHER)
Admission: EM | Admit: 2019-06-10 | Discharge: 2019-06-10 | Disposition: A | Discharge status: Home / Self Care | Payer: Medicaid Other | Attending: Emergency Medicine | Admitting: Emergency Medicine

## 2019-06-10 DIAGNOSIS — F17290 Nicotine dependence, other tobacco product, uncomplicated: Secondary | ICD-10-CM | POA: Diagnosis not present

## 2019-06-10 DIAGNOSIS — R369 Urethral discharge, unspecified: Secondary | ICD-10-CM | POA: Diagnosis present

## 2019-06-10 DIAGNOSIS — A549 Gonococcal infection, unspecified: Secondary | ICD-10-CM | POA: Insufficient documentation

## 2019-06-10 DIAGNOSIS — Z202 Contact with and (suspected) exposure to infections with a predominantly sexual mode of transmission: Secondary | ICD-10-CM

## 2019-06-10 MED ORDER — CEFTRIAXONE SODIUM 500 MG IJ SOLR
500.0000 mg | Freq: Once | INTRAMUSCULAR | Status: AC
  Administered 2019-06-10: 500 mg via INTRAMUSCULAR
  Filled 2019-06-10: qty 500

## 2019-06-10 MED ORDER — LIDOCAINE HCL (PF) 1 % IJ SOLN
INTRAMUSCULAR | Status: AC
  Filled 2019-06-10: qty 5

## 2019-06-10 NOTE — ED Triage Notes (Signed)
Brown penile discharge within the last week.  Also c/o difficulty with erections.  No fever, no burning with urinationl.

## 2019-06-10 NOTE — Discharge Instructions (Addendum)
Please abstain from sexual activity until you hear back about your chlamydia test.  If chlamydia test is positive you will need an additional antibiotic.  You can follow-up your test results online as well.  You have been treated for gonorrhea.  Please make any partners aware that they may have been exposed as well.
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
## 2019-06-11 LAB — GC/CHLAMYDIA PROBE AMP (~~LOC~~) NOT AT ARMC
Chlamydia: NEGATIVE
Comment: NEGATIVE
Comment: NORMAL
Neisseria Gonorrhea: POSITIVE — AB

## 2019-06-12 ENCOUNTER — Emergency Department (HOSPITAL_COMMUNITY)
Admission: EM | Admit: 2019-06-12 | Discharge: 2019-06-12 | Disposition: A | Discharge status: Home / Self Care | Payer: Medicaid Other | Attending: Emergency Medicine | Admitting: Emergency Medicine

## 2019-06-12 DIAGNOSIS — S6991XD Unspecified injury of right wrist, hand and finger(s), subsequent encounter: Secondary | ICD-10-CM

## 2019-06-12 DIAGNOSIS — X58XXXD Exposure to other specified factors, subsequent encounter: Secondary | ICD-10-CM | POA: Insufficient documentation

## 2019-06-12 DIAGNOSIS — S62316D Displaced fracture of base of fifth metacarpal bone, right hand, subsequent encounter for fracture with routine healing: Secondary | ICD-10-CM | POA: Diagnosis not present

## 2019-06-12 DIAGNOSIS — S62314D Displaced fracture of base of fourth metacarpal bone, right hand, subsequent encounter for fracture with routine healing: Secondary | ICD-10-CM | POA: Diagnosis not present

## 2019-06-12 DIAGNOSIS — Z4889 Encounter for other specified surgical aftercare: Secondary | ICD-10-CM | POA: Diagnosis not present
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
## 2019-06-12 NOTE — Discharge Instructions (Signed)
Follow up with Dr. Melvyn Novas as scheduled for Wednesday 4/7
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
## 2019-06-22 ENCOUNTER — Emergency Department (HOSPITAL_BASED_OUTPATIENT_CLINIC_OR_DEPARTMENT_OTHER)
Admission: EM | Admit: 2019-06-22 | Discharge: 2019-06-22 | Disposition: A | Discharge status: Home / Self Care | Payer: Medicaid Other | Attending: Emergency Medicine | Admitting: Emergency Medicine

## 2019-06-22 ENCOUNTER — Other Ambulatory Visit: Payer: Self-pay

## 2019-06-22 ENCOUNTER — Encounter (HOSPITAL_BASED_OUTPATIENT_CLINIC_OR_DEPARTMENT_OTHER): Payer: Self-pay | Admitting: *Deleted

## 2019-06-22 DIAGNOSIS — Z79899 Other long term (current) drug therapy: Secondary | ICD-10-CM | POA: Diagnosis not present

## 2019-06-22 DIAGNOSIS — F17210 Nicotine dependence, cigarettes, uncomplicated: Secondary | ICD-10-CM | POA: Diagnosis not present

## 2019-06-22 DIAGNOSIS — R369 Urethral discharge, unspecified: Secondary | ICD-10-CM | POA: Diagnosis not present

## 2019-06-22 DIAGNOSIS — Z202 Contact with and (suspected) exposure to infections with a predominantly sexual mode of transmission: Secondary | ICD-10-CM | POA: Diagnosis present

## 2019-06-22 LAB — URINALYSIS, MICROSCOPIC (REFLEX): RBC / HPF: NONE SEEN RBC/hpf (ref 0–5)

## 2019-06-22 LAB — URINALYSIS, ROUTINE W REFLEX MICROSCOPIC
Bilirubin Urine: NEGATIVE
Glucose, UA: NEGATIVE mg/dL
Hgb urine dipstick: NEGATIVE
Ketones, ur: NEGATIVE mg/dL
Nitrite: NEGATIVE
Protein, ur: NEGATIVE mg/dL
Specific Gravity, Urine: 1.02 (ref 1.005–1.030)
pH: 7.5 (ref 5.0–8.0)

## 2019-06-22 MED ORDER — DOXYCYCLINE HYCLATE 100 MG PO TABS
100.0000 mg | ORAL_TABLET | Freq: Once | ORAL | Status: AC
  Administered 2019-06-22: 23:00:00 100 mg via ORAL
  Filled 2019-06-22: qty 1

## 2019-06-22 MED ORDER — CEFTRIAXONE SODIUM 500 MG IJ SOLR
500.0000 mg | Freq: Once | INTRAMUSCULAR | Status: AC
  Administered 2019-06-22: 23:00:00 500 mg via INTRAMUSCULAR
  Filled 2019-06-22: qty 500

## 2019-06-22 MED ORDER — DOXYCYCLINE HYCLATE 100 MG PO CAPS: 100.0000 mg | ORAL_CAPSULE | Freq: Two times a day (BID) | ORAL | 0 refills | Status: AC

## 2019-06-22 NOTE — Discharge Instructions (Signed)
Please take the antibiotic as prescribed for your suspected sexually transmitted disease.  Please notify all your sexual partners that you likely have a sexual transmitted disease and they should all be tested for gonorrhea and chlamydia and receive appropriate treatment if applicable.  Recommend refraining from any sexual activity for at least the next 7 days and your partners have received appropriate testing and treatment.  Recommend following safe sex practices which include condom use.

## 2019-06-22 NOTE — ED Triage Notes (Signed)
C/o penis discharge x 2 days , seen and tx for STD
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
## 2019-06-24 LAB — GC/CHLAMYDIA PROBE AMP (~~LOC~~) NOT AT ARMC
Chlamydia: NEGATIVE
Comment: NEGATIVE
Comment: NORMAL
Neisseria Gonorrhea: POSITIVE — AB

## 2019-07-07 ENCOUNTER — Encounter (HOSPITAL_BASED_OUTPATIENT_CLINIC_OR_DEPARTMENT_OTHER): Payer: Self-pay

## 2019-07-07 ENCOUNTER — Emergency Department (HOSPITAL_BASED_OUTPATIENT_CLINIC_OR_DEPARTMENT_OTHER)
Admission: EM | Admit: 2019-07-07 | Discharge: 2019-07-07 | Disposition: A | Discharge status: Home / Self Care | Payer: Medicaid Other | Attending: Emergency Medicine | Admitting: Emergency Medicine

## 2019-07-07 ENCOUNTER — Other Ambulatory Visit: Payer: Self-pay

## 2019-07-07 DIAGNOSIS — Z79899 Other long term (current) drug therapy: Secondary | ICD-10-CM | POA: Insufficient documentation

## 2019-07-07 DIAGNOSIS — R369 Urethral discharge, unspecified: Secondary | ICD-10-CM | POA: Insufficient documentation

## 2019-07-07 DIAGNOSIS — F17210 Nicotine dependence, cigarettes, uncomplicated: Secondary | ICD-10-CM | POA: Insufficient documentation

## 2019-07-07 LAB — URINALYSIS, ROUTINE W REFLEX MICROSCOPIC
Bilirubin Urine: NEGATIVE
Glucose, UA: 100 mg/dL — AB
Hgb urine dipstick: NEGATIVE
Ketones, ur: NEGATIVE mg/dL
Nitrite: NEGATIVE
Protein, ur: NEGATIVE mg/dL
Specific Gravity, Urine: >1.03 — ABNORMAL HIGH (ref 1.005–1.030)
pH: 5.5 (ref 5.0–8.0)

## 2019-07-07 LAB — URINALYSIS, MICROSCOPIC (REFLEX): RBC / HPF: NONE SEEN RBC/hpf (ref 0–5)

## 2019-07-07 MED ORDER — DOXYCYCLINE HYCLATE 100 MG PO TABS
100.0000 mg | ORAL_TABLET | Freq: Once | ORAL | Status: AC
  Administered 2019-07-07: 17:00:00 100 mg via ORAL
  Filled 2019-07-07: qty 1

## 2019-07-07 MED ORDER — CEFTRIAXONE SODIUM 500 MG IJ SOLR
500.0000 mg | Freq: Once | INTRAMUSCULAR | Status: AC
  Administered 2019-07-07: 17:00:00 500 mg via INTRAMUSCULAR
  Filled 2019-07-07: qty 500

## 2019-07-07 MED ORDER — DOXYCYCLINE HYCLATE 100 MG PO CAPS: 100.0000 mg | ORAL_CAPSULE | Freq: Two times a day (BID) | ORAL | 0 refills | Status: DC

## 2019-07-07 NOTE — ED Triage Notes (Signed)
Exposed to gonorrhea, states passing it back and forth with SO. Brown discharge and began burning this AM.

## 2019-07-07 NOTE — Discharge Instructions (Addendum)
Please wait 2 weeks AND be sure that you and your partners are symptom free before returning to sexual activity. Please use protection with every sexual encounter.  Please take antibiotics for entire 1 week.
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
## 2019-07-08 LAB — GC/CHLAMYDIA PROBE AMP (~~LOC~~) NOT AT ARMC
Chlamydia: NEGATIVE
Comment: NEGATIVE
Comment: NORMAL
Neisseria Gonorrhea: POSITIVE — AB

## 2019-07-10 ENCOUNTER — Emergency Department (HOSPITAL_BASED_OUTPATIENT_CLINIC_OR_DEPARTMENT_OTHER)
Admission: EM | Admit: 2019-07-10 | Discharge: 2019-07-10 | Disposition: A | Discharge status: Home / Self Care | Payer: Medicaid Other | Attending: Emergency Medicine | Admitting: Emergency Medicine

## 2019-07-10 ENCOUNTER — Other Ambulatory Visit: Payer: Self-pay

## 2019-07-10 ENCOUNTER — Encounter (HOSPITAL_BASED_OUTPATIENT_CLINIC_OR_DEPARTMENT_OTHER): Payer: Self-pay | Admitting: Emergency Medicine

## 2019-07-10 ENCOUNTER — Emergency Department (HOSPITAL_BASED_OUTPATIENT_CLINIC_OR_DEPARTMENT_OTHER): Payer: Medicaid Other

## 2019-07-10 DIAGNOSIS — Y929 Unspecified place or not applicable: Secondary | ICD-10-CM | POA: Insufficient documentation

## 2019-07-10 DIAGNOSIS — Y9389 Activity, other specified: Secondary | ICD-10-CM | POA: Diagnosis not present

## 2019-07-10 DIAGNOSIS — S60512A Abrasion of left hand, initial encounter: Secondary | ICD-10-CM | POA: Insufficient documentation

## 2019-07-10 DIAGNOSIS — W19XXXA Unspecified fall, initial encounter: Secondary | ICD-10-CM

## 2019-07-10 DIAGNOSIS — S62316D Displaced fracture of base of fifth metacarpal bone, right hand, subsequent encounter for fracture with routine healing: Secondary | ICD-10-CM | POA: Insufficient documentation

## 2019-07-10 DIAGNOSIS — M25562 Pain in left knee: Secondary | ICD-10-CM | POA: Diagnosis not present

## 2019-07-10 DIAGNOSIS — Z79899 Other long term (current) drug therapy: Secondary | ICD-10-CM | POA: Insufficient documentation

## 2019-07-10 DIAGNOSIS — W109XXA Fall (on) (from) unspecified stairs and steps, initial encounter: Secondary | ICD-10-CM | POA: Diagnosis not present

## 2019-07-10 DIAGNOSIS — Y999 Unspecified external cause status: Secondary | ICD-10-CM | POA: Diagnosis not present

## 2019-07-10 DIAGNOSIS — F17290 Nicotine dependence, other tobacco product, uncomplicated: Secondary | ICD-10-CM | POA: Diagnosis not present

## 2019-07-10 DIAGNOSIS — M79641 Pain in right hand: Secondary | ICD-10-CM

## 2019-07-10 DIAGNOSIS — S6992XA Unspecified injury of left wrist, hand and finger(s), initial encounter: Secondary | ICD-10-CM | POA: Diagnosis present

## 2019-07-10 MED ORDER — IBUPROFEN 400 MG PO TABS
600.0000 mg | ORAL_TABLET | Freq: Once | ORAL | Status: AC
  Administered 2019-07-10: 600 mg via ORAL
  Filled 2019-07-10: qty 1

## 2019-07-10 NOTE — ED Notes (Signed)
ED Provider at bedside. 
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
## 2019-07-10 NOTE — ED Notes (Signed)
Patient transported to X-ray 

## 2019-07-10 NOTE — Discharge Instructions (Signed)
Please follow up with your orthopedic surgeon for your next scheduled visit.  You can put ice on your hand for 10 minutes at a time at home.  Try motrin 600 mg every 6 hours for pain, for the next 5-7 days.  Please be more careful in the future.  Every time you fall or injury your hand, you run the risk of causing permanent damage or pain that can last for the rest of your life.
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
## 2019-10-18 ENCOUNTER — Emergency Department (HOSPITAL_BASED_OUTPATIENT_CLINIC_OR_DEPARTMENT_OTHER): Payer: Medicaid Other

## 2019-10-18 ENCOUNTER — Inpatient Hospital Stay (HOSPITAL_BASED_OUTPATIENT_CLINIC_OR_DEPARTMENT_OTHER)
Admission: EM | Admit: 2019-10-18 | Discharge: 2019-10-20 | DRG: 958 | Disposition: A | Discharge status: Home / Self Care | Payer: Medicaid Other | Attending: General Surgery | Admitting: General Surgery

## 2019-10-18 ENCOUNTER — Telehealth (HOSPITAL_BASED_OUTPATIENT_CLINIC_OR_DEPARTMENT_OTHER): Payer: Self-pay | Admitting: *Deleted

## 2019-10-18 ENCOUNTER — Encounter (HOSPITAL_COMMUNITY): Admission: EM | Disposition: A | Discharge status: Home / Self Care | Payer: Self-pay | Source: Home / Self Care

## 2019-10-18 ENCOUNTER — Inpatient Hospital Stay (HOSPITAL_COMMUNITY): Payer: Medicaid Other

## 2019-10-18 ENCOUNTER — Encounter (HOSPITAL_BASED_OUTPATIENT_CLINIC_OR_DEPARTMENT_OTHER): Payer: Self-pay | Admitting: Emergency Medicine

## 2019-10-18 ENCOUNTER — Inpatient Hospital Stay (HOSPITAL_COMMUNITY): Payer: Medicaid Other | Admitting: Anesthesiology

## 2019-10-18 DIAGNOSIS — Z792 Long term (current) use of antibiotics: Secondary | ICD-10-CM

## 2019-10-18 DIAGNOSIS — Y92810 Car as the place of occurrence of the external cause: Secondary | ICD-10-CM | POA: Diagnosis not present

## 2019-10-18 DIAGNOSIS — S52045A Nondisplaced fracture of coronoid process of left ulna, initial encounter for closed fracture: Secondary | ICD-10-CM | POA: Diagnosis present

## 2019-10-18 DIAGNOSIS — F17290 Nicotine dependence, other tobacco product, uncomplicated: Secondary | ICD-10-CM | POA: Diagnosis present

## 2019-10-18 DIAGNOSIS — I609 Nontraumatic subarachnoid hemorrhage, unspecified: Secondary | ICD-10-CM | POA: Diagnosis present

## 2019-10-18 DIAGNOSIS — Z23 Encounter for immunization: Secondary | ICD-10-CM | POA: Diagnosis not present

## 2019-10-18 DIAGNOSIS — S42402B Unspecified fracture of lower end of left humerus, initial encounter for open fracture: Secondary | ICD-10-CM

## 2019-10-18 DIAGNOSIS — D62 Acute posthemorrhagic anemia: Secondary | ICD-10-CM | POA: Diagnosis not present

## 2019-10-18 DIAGNOSIS — E876 Hypokalemia: Secondary | ICD-10-CM | POA: Diagnosis present

## 2019-10-18 DIAGNOSIS — Y9389 Activity, other specified: Secondary | ICD-10-CM | POA: Diagnosis not present

## 2019-10-18 DIAGNOSIS — Z419 Encounter for procedure for purposes other than remedying health state, unspecified: Secondary | ICD-10-CM

## 2019-10-18 DIAGNOSIS — T148XXA Other injury of unspecified body region, initial encounter: Secondary | ICD-10-CM

## 2019-10-18 DIAGNOSIS — S30811A Abrasion of abdominal wall, initial encounter: Secondary | ICD-10-CM | POA: Diagnosis present

## 2019-10-18 DIAGNOSIS — Z20822 Contact with and (suspected) exposure to covid-19: Secondary | ICD-10-CM | POA: Diagnosis present

## 2019-10-18 DIAGNOSIS — S52272A Monteggia's fracture of left ulna, initial encounter for closed fracture: Secondary | ICD-10-CM | POA: Diagnosis present

## 2019-10-18 DIAGNOSIS — Z79891 Long term (current) use of opiate analgesic: Secondary | ICD-10-CM | POA: Diagnosis not present

## 2019-10-18 DIAGNOSIS — S42492B Other displaced fracture of lower end of left humerus, initial encounter for open fracture: Secondary | ICD-10-CM | POA: Insufficient documentation

## 2019-10-18 DIAGNOSIS — S52042A Displaced fracture of coronoid process of left ulna, initial encounter for closed fracture: Secondary | ICD-10-CM

## 2019-10-18 DIAGNOSIS — R202 Paresthesia of skin: Secondary | ICD-10-CM | POA: Diagnosis present

## 2019-10-18 DIAGNOSIS — S066X0A Traumatic subarachnoid hemorrhage without loss of consciousness, initial encounter: Secondary | ICD-10-CM | POA: Diagnosis present

## 2019-10-18 DIAGNOSIS — S0031XA Abrasion of nose, initial encounter: Secondary | ICD-10-CM | POA: Diagnosis present

## 2019-10-18 DIAGNOSIS — S0182XA Laceration with foreign body of other part of head, initial encounter: Secondary | ICD-10-CM | POA: Diagnosis present

## 2019-10-18 DIAGNOSIS — W3400XA Accidental discharge from unspecified firearms or gun, initial encounter: Secondary | ICD-10-CM | POA: Diagnosis not present

## 2019-10-18 DIAGNOSIS — S52022B Displaced fracture of olecranon process without intraarticular extension of left ulna, initial encounter for open fracture type I or II: Secondary | ICD-10-CM | POA: Diagnosis present

## 2019-10-18 HISTORY — PX: ORIF HUMERUS FRACTURE: SHX2126

## 2019-10-18 LAB — CBC WITH DIFFERENTIAL/PLATELET
Abs Immature Granulocytes: 0.02 10*3/uL (ref 0.00–0.07)
Basophils Absolute: 0.1 10*3/uL (ref 0.0–0.1)
Basophils Relative: 1 %
Eosinophils Absolute: 0 10*3/uL (ref 0.0–0.5)
Eosinophils Relative: 0 %
HCT: 43.3 % (ref 39.0–52.0)
Hemoglobin: 15.3 g/dL (ref 13.0–17.0)
Immature Granulocytes: 0 %
Lymphocytes Relative: 61 %
Lymphs Abs: 4.3 10*3/uL — ABNORMAL HIGH (ref 0.7–4.0)
MCH: 35.6 pg — ABNORMAL HIGH (ref 26.0–34.0)
MCHC: 35.3 g/dL (ref 30.0–36.0)
MCV: 100.7 fL — ABNORMAL HIGH (ref 80.0–100.0)
Monocytes Absolute: 0.5 10*3/uL (ref 0.1–1.0)
Monocytes Relative: 6 %
Neutro Abs: 2.2 10*3/uL (ref 1.7–7.7)
Neutrophils Relative %: 32 %
Platelets: 222 10*3/uL (ref 150–400)
RBC: 4.3 MIL/uL (ref 4.22–5.81)
RDW: 12.7 % (ref 11.5–15.5)
WBC: 7.1 10*3/uL (ref 4.0–10.5)
nRBC: 0 % (ref 0.0–0.2)

## 2019-10-18 LAB — BASIC METABOLIC PANEL
Anion gap: 11 (ref 5–15)
Anion gap: 14 (ref 5–15)
BUN: 10 mg/dL (ref 6–20)
BUN: 13 mg/dL (ref 6–20)
CO2: 22 mmol/L (ref 22–32)
CO2: 25 mmol/L (ref 22–32)
Calcium: 8.9 mg/dL (ref 8.9–10.3)
Calcium: 9.6 mg/dL (ref 8.9–10.3)
Chloride: 103 mmol/L (ref 98–111)
Chloride: 104 mmol/L (ref 98–111)
Creatinine, Ser: 0.85 mg/dL (ref 0.61–1.24)
Creatinine, Ser: 1 mg/dL (ref 0.61–1.24)
GFR calc Af Amer: >60 mL/min (ref >60)
GFR calc Af Amer: >60 mL/min (ref >60)
GFR calc non Af Amer: >60 mL/min (ref >60)
GFR calc non Af Amer: >60 mL/min (ref >60)
Glucose, Bld: 108 mg/dL — ABNORMAL HIGH (ref 70–99)
Glucose, Bld: 121 mg/dL — ABNORMAL HIGH (ref 70–99)
Potassium: 3.2 mmol/L — ABNORMAL LOW (ref 3.5–5.1)
Potassium: 3.5 mmol/L (ref 3.5–5.1)
Sodium: 139 mmol/L (ref 135–145)
Sodium: 140 mmol/L (ref 135–145)

## 2019-10-18 LAB — POCT I-STAT, CHEM 8
BUN: 9 mg/dL (ref 6–20)
Calcium, Ion: 1.28 mmol/L (ref 1.15–1.40)
Chloride: 98 mmol/L (ref 98–111)
Creatinine, Ser: 0.8 mg/dL (ref 0.61–1.24)
Glucose, Bld: 142 mg/dL — ABNORMAL HIGH (ref 70–99)
HCT: 34 % — ABNORMAL LOW (ref 39.0–52.0)
Hemoglobin: 11.6 g/dL — ABNORMAL LOW (ref 13.0–17.0)
Potassium: 4.1 mmol/L (ref 3.5–5.1)
Sodium: 135 mmol/L (ref 135–145)
TCO2: 27 mmol/L (ref 22–32)

## 2019-10-18 LAB — TYPE AND SCREEN
ABO/RH(D): A POS
Antibody Screen: NEGATIVE

## 2019-10-18 LAB — CBC
HCT: 37.3 % — ABNORMAL LOW (ref 39.0–52.0)
Hemoglobin: 13.3 g/dL (ref 13.0–17.0)
MCH: 36.1 pg — ABNORMAL HIGH (ref 26.0–34.0)
MCHC: 35.7 g/dL (ref 30.0–36.0)
MCV: 101.4 fL — ABNORMAL HIGH (ref 80.0–100.0)
Platelets: 199 10*3/uL (ref 150–400)
RBC: 3.68 MIL/uL — ABNORMAL LOW (ref 4.22–5.81)
RDW: 12.3 % (ref 11.5–15.5)
WBC: 9.2 10*3/uL (ref 4.0–10.5)
nRBC: 0 % (ref 0.0–0.2)

## 2019-10-18 LAB — SURGICAL PCR SCREEN
MRSA, PCR: NEGATIVE
Staphylococcus aureus: NEGATIVE

## 2019-10-18 LAB — SARS CORONAVIRUS 2 BY RT PCR (HOSPITAL ORDER, PERFORMED IN ~~LOC~~ HOSPITAL LAB): SARS Coronavirus 2: NEGATIVE

## 2019-10-18 LAB — MRSA PCR SCREENING: MRSA by PCR: NEGATIVE

## 2019-10-18 LAB — VITAMIN D 25 HYDROXY (VIT D DEFICIENCY, FRACTURES): Vit D, 25-Hydroxy: 7 ng/mL — ABNORMAL LOW (ref 30–100)

## 2019-10-18 LAB — ETHANOL: Alcohol, Ethyl (B): 43 mg/dL — ABNORMAL HIGH (ref <10)

## 2019-10-18 LAB — ABO/RH: ABO/RH(D): A POS

## 2019-10-18 SURGERY — OPEN REDUCTION INTERNAL FIXATION (ORIF) DISTAL HUMERUS FRACTURE
Anesthesia: Regional | Site: Arm Upper | Laterality: Left

## 2019-10-18 MED ORDER — PANTOPRAZOLE SODIUM 40 MG PO TBEC: 40.0000 mg | DELAYED_RELEASE_TABLET | Freq: Every day | ORAL | Status: DC

## 2019-10-18 MED ORDER — ONDANSETRON HCL 4 MG/2ML IJ SOLN: 4.0000 mg | Freq: Four times a day (QID) | INTRAMUSCULAR | Status: DC | PRN

## 2019-10-18 MED ORDER — FENTANYL CITRATE (PF) 100 MCG/2ML IJ SOLN
INTRAMUSCULAR | Status: AC
  Filled 2019-10-18: qty 2

## 2019-10-18 MED ORDER — VANCOMYCIN HCL 1000 MG IV SOLR
INTRAVENOUS | Status: DC | PRN
  Administered 2019-10-18: 1000 mg via TOPICAL

## 2019-10-18 MED ORDER — METHOCARBAMOL 500 MG PO TABS
500.0000 mg | ORAL_TABLET | Freq: Four times a day (QID) | ORAL | Status: DC | PRN
  Administered 2019-10-19: 500 mg via ORAL
  Filled 2019-10-18: qty 1

## 2019-10-18 MED ORDER — MUPIROCIN 2 % EX OINT
1.0000 "application " | TOPICAL_OINTMENT | Freq: Two times a day (BID) | CUTANEOUS | Status: DC
  Administered 2019-10-18 – 2019-10-20 (×4): 1 via NASAL
  Filled 2019-10-18 (×2): qty 22

## 2019-10-18 MED ORDER — MIDAZOLAM HCL 5 MG/5ML IJ SOLN
INTRAMUSCULAR | Status: DC | PRN
  Administered 2019-10-18: 2 mg via INTRAVENOUS

## 2019-10-18 MED ORDER — VANCOMYCIN HCL 1000 MG IV SOLR
INTRAVENOUS | Status: AC
  Filled 2019-10-18: qty 1000

## 2019-10-18 MED ORDER — CEFAZOLIN SODIUM-DEXTROSE 2-4 GM/100ML-% IV SOLN
2.0000 g | Freq: Three times a day (TID) | INTRAVENOUS | Status: DC
  Administered 2019-10-18: 2 g via INTRAVENOUS
  Filled 2019-10-18 (×2): qty 100

## 2019-10-18 MED ORDER — ONDANSETRON 4 MG PO TBDP: 4.0000 mg | ORAL_TABLET | Freq: Four times a day (QID) | ORAL | Status: DC | PRN

## 2019-10-18 MED ORDER — CEFAZOLIN SODIUM-DEXTROSE 2-4 GM/100ML-% IV SOLN
2.0000 g | INTRAVENOUS | Status: AC
  Administered 2019-10-18: 2 g via INTRAVENOUS

## 2019-10-18 MED ORDER — FENTANYL CITRATE (PF) 250 MCG/5ML IJ SOLN
INTRAMUSCULAR | Status: AC
  Filled 2019-10-18: qty 5

## 2019-10-18 MED ORDER — LEVETIRACETAM IN NACL 500 MG/100ML IV SOLN
500.0000 mg | Freq: Two times a day (BID) | INTRAVENOUS | Status: DC
  Administered 2019-10-18 – 2019-10-20 (×5): 500 mg via INTRAVENOUS
  Filled 2019-10-18 (×5): qty 100

## 2019-10-18 MED ORDER — HYDROMORPHONE HCL 1 MG/ML IJ SOLN
0.5000 mg | INTRAMUSCULAR | Status: DC | PRN
  Administered 2019-10-18: 1 mg via INTRAVENOUS
  Filled 2019-10-18: qty 1

## 2019-10-18 MED ORDER — DEXAMETHASONE SODIUM PHOSPHATE 10 MG/ML IJ SOLN
INTRAMUSCULAR | Status: DC | PRN
Start: 2019-10-18 — End: 2019-10-18
  Administered 2019-10-18: 5 mg via INTRAVENOUS

## 2019-10-18 MED ORDER — MORPHINE SULFATE 2 MG/ML IJ SOLN
INTRAMUSCULAR | Status: AC | PRN
  Administered 2019-10-18: 4 mg via INTRAVENOUS

## 2019-10-18 MED ORDER — HYDROMORPHONE HCL 1 MG/ML IJ SOLN: 0.2500 mg | INTRAMUSCULAR | Status: DC | PRN

## 2019-10-18 MED ORDER — PROPOFOL 10 MG/ML IV BOLUS
INTRAVENOUS | Status: AC
  Filled 2019-10-18: qty 20

## 2019-10-18 MED ORDER — DIPHENHYDRAMINE HCL 25 MG PO CAPS
25.0000 mg | ORAL_CAPSULE | Freq: Four times a day (QID) | ORAL | Status: DC | PRN
  Administered 2019-10-18 – 2019-10-20 (×3): 25 mg via ORAL
  Filled 2019-10-18 (×3): qty 1

## 2019-10-18 MED ORDER — OXYCODONE HCL 5 MG/5ML PO SOLN: 5.0000 mg | Freq: Once | ORAL | Status: DC | PRN

## 2019-10-18 MED ORDER — DOCUSATE SODIUM 100 MG PO CAPS: 100.0000 mg | ORAL_CAPSULE | Freq: Two times a day (BID) | ORAL | Status: DC

## 2019-10-18 MED ORDER — OXYCODONE HCL 5 MG PO TABS
10.0000 mg | ORAL_TABLET | ORAL | Status: DC | PRN
  Administered 2019-10-19 – 2019-10-20 (×2): 15 mg via ORAL
  Filled 2019-10-18 (×2): qty 3

## 2019-10-18 MED ORDER — ACETAMINOPHEN 500 MG PO TABS: 1000.0000 mg | ORAL_TABLET | Freq: Once | ORAL | Status: DC

## 2019-10-18 MED ORDER — PROCHLORPERAZINE MALEATE 10 MG PO TABS
10.0000 mg | ORAL_TABLET | Freq: Four times a day (QID) | ORAL | Status: DC | PRN
  Filled 2019-10-18: qty 1

## 2019-10-18 MED ORDER — HYDROMORPHONE HCL 1 MG/ML IJ SOLN
INTRAMUSCULAR | Status: DC | PRN
  Administered 2019-10-18: 1 mg via INTRAVENOUS

## 2019-10-18 MED ORDER — CHLORHEXIDINE GLUCONATE 4 % EX LIQD: 60.0000 mL | Freq: Once | CUTANEOUS | Status: DC

## 2019-10-18 MED ORDER — DEXAMETHASONE SODIUM PHOSPHATE 10 MG/ML IJ SOLN
INTRAMUSCULAR | Status: AC
  Filled 2019-10-18: qty 1

## 2019-10-18 MED ORDER — CHLORHEXIDINE GLUCONATE 0.12 % MT SOLN
OROMUCOSAL | Status: AC
  Filled 2019-10-18: qty 15

## 2019-10-18 MED ORDER — METOCLOPRAMIDE HCL 5 MG PO TABS
5.0000 mg | ORAL_TABLET | Freq: Three times a day (TID) | ORAL | Status: DC | PRN
  Filled 2019-10-18: qty 2

## 2019-10-18 MED ORDER — CEFAZOLIN SODIUM-DEXTROSE 2-4 GM/100ML-% IV SOLN
2.0000 g | Freq: Once | INTRAVENOUS | Status: AC
  Administered 2019-10-18: 2 g via INTRAVENOUS

## 2019-10-18 MED ORDER — HYDROMORPHONE HCL 1 MG/ML IJ SOLN
0.5000 mg | INTRAMUSCULAR | Status: DC | PRN
  Administered 2019-10-18 – 2019-10-19 (×3): 1 mg via INTRAVENOUS
  Filled 2019-10-18 (×3): qty 1

## 2019-10-18 MED ORDER — MORPHINE SULFATE (PF) 2 MG/ML IV SOLN
1.0000 mg | INTRAVENOUS | Status: DC | PRN
  Administered 2019-10-18 (×2): 2 mg via INTRAVENOUS
  Filled 2019-10-18 (×2): qty 1

## 2019-10-18 MED ORDER — MORPHINE SULFATE (PF) 4 MG/ML IV SOLN: 4.0000 mg | Freq: Once | INTRAVENOUS | Status: DC

## 2019-10-18 MED ORDER — HYDROMORPHONE HCL 1 MG/ML IJ SOLN
INTRAMUSCULAR | Status: AC
  Filled 2019-10-18: qty 1

## 2019-10-18 MED ORDER — TOBRAMYCIN SULFATE 1.2 G IJ SOLR
INTRAMUSCULAR | Status: AC
  Filled 2019-10-18: qty 1.2

## 2019-10-18 MED ORDER — DEXMEDETOMIDINE (PRECEDEX) IN NS 20 MCG/5ML (4 MCG/ML) IV SYRINGE
PREFILLED_SYRINGE | INTRAVENOUS | Status: DC | PRN
  Administered 2019-10-18: 12 ug via INTRAVENOUS
  Administered 2019-10-18: 8 ug via INTRAVENOUS

## 2019-10-18 MED ORDER — TETANUS-DIPHTH-ACELL PERTUSSIS 5-2.5-18.5 LF-MCG/0.5 IM SUSP
0.5000 mL | Freq: Once | INTRAMUSCULAR | Status: AC
  Administered 2019-10-18: 0.5 mL via INTRAMUSCULAR
  Filled 2019-10-18: qty 0.5

## 2019-10-18 MED ORDER — ONDANSETRON HCL 4 MG/2ML IJ SOLN
INTRAMUSCULAR | Status: DC | PRN
  Administered 2019-10-18: 4 mg via INTRAVENOUS

## 2019-10-18 MED ORDER — ONDANSETRON HCL 4 MG PO TABS: 4.0000 mg | ORAL_TABLET | Freq: Four times a day (QID) | ORAL | Status: DC | PRN

## 2019-10-18 MED ORDER — OXYCODONE HCL 5 MG PO TABS
5.0000 mg | ORAL_TABLET | ORAL | Status: DC | PRN
  Administered 2019-10-19: 5 mg via ORAL
  Filled 2019-10-18: qty 1

## 2019-10-18 MED ORDER — ROCURONIUM BROMIDE 10 MG/ML (PF) SYRINGE
PREFILLED_SYRINGE | INTRAVENOUS | Status: AC
  Filled 2019-10-18: qty 10

## 2019-10-18 MED ORDER — CHLORHEXIDINE GLUCONATE 0.12 % MT SOLN: 15.0000 mL | OROMUCOSAL | Status: AC

## 2019-10-18 MED ORDER — SUCCINYLCHOLINE CHLORIDE 20 MG/ML IJ SOLN
INTRAMUSCULAR | Status: DC | PRN
Start: 2019-10-18 — End: 2019-10-18
  Administered 2019-10-18: 80 mg via INTRAVENOUS

## 2019-10-18 MED ORDER — PANTOPRAZOLE SODIUM 40 MG IV SOLR: 40.0000 mg | Freq: Every day | INTRAVENOUS | Status: DC

## 2019-10-18 MED ORDER — SUGAMMADEX SODIUM 200 MG/2ML IV SOLN
INTRAVENOUS | Status: DC | PRN
  Administered 2019-10-18: 200 mg via INTRAVENOUS

## 2019-10-18 MED ORDER — OXYCODONE HCL 5 MG PO TABS: 5.0000 mg | ORAL_TABLET | Freq: Once | ORAL | Status: DC | PRN

## 2019-10-18 MED ORDER — ACETAMINOPHEN 10 MG/ML IV SOLN
1000.0000 mg | Freq: Four times a day (QID) | INTRAVENOUS | Status: AC
  Administered 2019-10-18 – 2019-10-19 (×2): 1000 mg via INTRAVENOUS
  Filled 2019-10-18 (×3): qty 100

## 2019-10-18 MED ORDER — LACTATED RINGERS IV SOLN: INTRAVENOUS | Status: DC

## 2019-10-18 MED ORDER — METOCLOPRAMIDE HCL 5 MG/ML IJ SOLN: 5.0000 mg | Freq: Three times a day (TID) | INTRAMUSCULAR | Status: DC | PRN

## 2019-10-18 MED ORDER — DOCUSATE SODIUM 100 MG PO CAPS
100.0000 mg | ORAL_CAPSULE | Freq: Two times a day (BID) | ORAL | Status: DC
  Administered 2019-10-19 – 2019-10-20 (×3): 100 mg via ORAL
  Filled 2019-10-18 (×3): qty 1

## 2019-10-18 MED ORDER — METHOCARBAMOL 1000 MG/10ML IJ SOLN
500.0000 mg | Freq: Four times a day (QID) | INTRAVENOUS | Status: DC | PRN
  Filled 2019-10-18: qty 5

## 2019-10-18 MED ORDER — LIDOCAINE 2% (20 MG/ML) 5 ML SYRINGE
INTRAMUSCULAR | Status: DC | PRN
  Administered 2019-10-18: 60 mg via INTRAVENOUS

## 2019-10-18 MED ORDER — OXYCODONE HCL 5 MG PO TABS
5.0000 mg | ORAL_TABLET | ORAL | Status: DC | PRN
  Administered 2019-10-18: 10 mg via ORAL
  Filled 2019-10-18: qty 2

## 2019-10-18 MED ORDER — HYDROMORPHONE HCL 1 MG/ML IJ SOLN: 0.5000 mg | INTRAMUSCULAR | Status: DC | PRN

## 2019-10-18 MED ORDER — ACETAMINOPHEN 325 MG PO TABS: 650.0000 mg | ORAL_TABLET | ORAL | Status: DC | PRN

## 2019-10-18 MED ORDER — PHENYLEPHRINE HCL (PRESSORS) 10 MG/ML IV SOLN
INTRAVENOUS | Status: DC | PRN
Start: 2019-10-18 — End: 2019-10-18
  Administered 2019-10-18 (×2): 80 ug via INTRAVENOUS
  Administered 2019-10-18 (×2): 120 ug via INTRAVENOUS

## 2019-10-18 MED ORDER — PROCHLORPERAZINE EDISYLATE 10 MG/2ML IJ SOLN: 5.0000 mg | Freq: Four times a day (QID) | INTRAMUSCULAR | Status: DC | PRN

## 2019-10-18 MED ORDER — ONDANSETRON HCL 4 MG/2ML IJ SOLN
4.0000 mg | Freq: Once | INTRAMUSCULAR | Status: AC
  Administered 2019-10-18: 4 mg via INTRAVENOUS

## 2019-10-18 MED ORDER — HYDROMORPHONE HCL 1 MG/ML IJ SOLN
1.0000 mg | Freq: Once | INTRAMUSCULAR | Status: AC
  Administered 2019-10-18: 1 mg via INTRAVENOUS
  Filled 2019-10-18: qty 1

## 2019-10-18 MED ORDER — MIDAZOLAM HCL 2 MG/2ML IJ SOLN
INTRAMUSCULAR | Status: AC
  Filled 2019-10-18: qty 2

## 2019-10-18 MED ORDER — POLYETHYLENE GLYCOL 3350 17 G PO PACK: 17.0000 g | PACK | Freq: Every day | ORAL | Status: DC | PRN

## 2019-10-18 MED ORDER — BACITRACIN ZINC 500 UNIT/GM EX OINT
TOPICAL_OINTMENT | CUTANEOUS | Status: AC
  Filled 2019-10-18: qty 28.35

## 2019-10-18 MED ORDER — PROMETHAZINE HCL 25 MG/ML IJ SOLN: 6.2500 mg | INTRAMUSCULAR | Status: DC | PRN

## 2019-10-18 MED ORDER — ALBUMIN HUMAN 5 % IV SOLN: INTRAVENOUS | Status: DC | PRN

## 2019-10-18 MED ORDER — KCL IN DEXTROSE-NACL 20-5-0.45 MEQ/L-%-% IV SOLN
INTRAVENOUS | Status: DC
  Filled 2019-10-18: qty 1000

## 2019-10-18 MED ORDER — 0.9 % SODIUM CHLORIDE (POUR BTL) OPTIME
TOPICAL | Status: DC | PRN
  Administered 2019-10-18: 1000 mL

## 2019-10-18 MED ORDER — TOBRAMYCIN SULFATE 1.2 G IJ SOLR
INTRAMUSCULAR | Status: DC | PRN
  Administered 2019-10-18: 1.2 g via TOPICAL

## 2019-10-18 MED ORDER — SODIUM CHLORIDE 0.9 % IV SOLN
2.0000 g | INTRAVENOUS | Status: DC
  Administered 2019-10-18 – 2019-10-19 (×2): 2 g via INTRAVENOUS
  Filled 2019-10-18 (×3): qty 20

## 2019-10-18 MED ORDER — POTASSIUM CHLORIDE IN NACL 20-0.9 MEQ/L-% IV SOLN
INTRAVENOUS | Status: DC
  Filled 2019-10-18: qty 1000

## 2019-10-18 MED ORDER — BISACODYL 10 MG RE SUPP: 10.0000 mg | Freq: Every day | RECTAL | Status: DC | PRN

## 2019-10-18 MED ORDER — POVIDONE-IODINE 10 % EX SWAB: 2.0000 "application " | Freq: Once | CUTANEOUS | Status: DC

## 2019-10-18 MED ORDER — PROPOFOL 10 MG/ML IV BOLUS
INTRAVENOUS | Status: DC | PRN
  Administered 2019-10-18: 200 mg via INTRAVENOUS

## 2019-10-18 MED ORDER — ROCURONIUM BROMIDE 10 MG/ML (PF) SYRINGE
PREFILLED_SYRINGE | INTRAVENOUS | Status: DC | PRN
  Administered 2019-10-18: 40 mg via INTRAVENOUS

## 2019-10-18 MED ORDER — FENTANYL CITRATE (PF) 250 MCG/5ML IJ SOLN
INTRAMUSCULAR | Status: DC | PRN
  Administered 2019-10-18: 50 ug via INTRAVENOUS
  Administered 2019-10-18 (×2): 100 ug via INTRAVENOUS

## 2019-10-18 MED ORDER — CHLORHEXIDINE GLUCONATE CLOTH 2 % EX PADS
6.0000 | MEDICATED_PAD | Freq: Every day | CUTANEOUS | Status: DC
  Administered 2019-10-19 – 2019-10-20 (×2): 6 via TOPICAL

## 2019-10-18 SURGICAL SUPPLY — 123 items
BIT DRILL 2.0 (BIT) ×1
BIT DRILL 2.0MM (BIT) ×1
BIT DRILL 2.5X2.75 QC CALB (BIT) ×3 IMPLANT
BIT DRILL 2XNS DISP SS SM FRAG (BIT) ×1 IMPLANT
BIT DRILL CALIBRATED 2.7 (BIT) ×2 IMPLANT
BIT DRILL CALIBRATED 2.7MM (BIT) ×1
BIT DRILL Q-C 2.0 DIA 100 (BIT) ×2 IMPLANT
BIT DRILL Q-C 2.0MM DIA 100MM (BIT) ×1
BIT DRL 2XNS DISP SS SM FRAG (BIT) ×1
BLADE AVERAGE 25MMX9MM (BLADE)
BLADE AVERAGE 25X9 (BLADE) IMPLANT
BNDG ELASTIC 3X5.8 VLCR STR LF (GAUZE/BANDAGES/DRESSINGS) ×3 IMPLANT
BNDG ELASTIC 4X5.8 VLCR STR LF (GAUZE/BANDAGES/DRESSINGS) ×3 IMPLANT
BNDG ESMARK 4X9 LF (GAUZE/BANDAGES/DRESSINGS) ×3 IMPLANT
BNDG GAUZE ELAST 4 BULKY (GAUZE/BANDAGES/DRESSINGS) ×6 IMPLANT
BRUSH SCRUB EZ PLAIN DRY (MISCELLANEOUS) ×6 IMPLANT
CLEANER TIP ELECTROSURG 2X2 (MISCELLANEOUS) ×3 IMPLANT
CORD BIPOLAR FORCEPS 12FT (ELECTRODE) ×3 IMPLANT
COVER SURGICAL LIGHT HANDLE (MISCELLANEOUS) ×6 IMPLANT
COVER WAND RF STERILE (DRAPES) IMPLANT
DRAIN PENROSE 1/4X12 LTX STRL (WOUND CARE) IMPLANT
DRAPE C-ARM 42X72 X-RAY (DRAPES) ×3 IMPLANT
DRAPE C-ARMOR (DRAPES) IMPLANT
DRAPE HALF SHEET 40X57 (DRAPES) ×3 IMPLANT
DRAPE INCISE IOBAN 66X45 STRL (DRAPES) ×3 IMPLANT
DRAPE ORTHO SPLIT 77X108 STRL (DRAPES)
DRAPE SURG ORHT 6 SPLT 77X108 (DRAPES) IMPLANT
DRAPE U-SHAPE 47X51 STRL (DRAPES) ×6 IMPLANT
DRSG ADAPTIC 3X8 NADH LF (GAUZE/BANDAGES/DRESSINGS) ×3 IMPLANT
DRSG MEPITEL 4X7.2 (GAUZE/BANDAGES/DRESSINGS) ×3 IMPLANT
DRSG PAD ABDOMINAL 8X10 ST (GAUZE/BANDAGES/DRESSINGS) ×3 IMPLANT
ELECT REM PT RETURN 9FT ADLT (ELECTROSURGICAL) ×3
ELECTRODE REM PT RTRN 9FT ADLT (ELECTROSURGICAL) ×1 IMPLANT
EVACUATOR 1/8 PVC DRAIN (DRAIN) IMPLANT
GAUZE SPONGE 4X4 12PLY STRL (GAUZE/BANDAGES/DRESSINGS) ×3 IMPLANT
GLOVE BIO SURGEON STRL SZ 6.5 (GLOVE) ×6 IMPLANT
GLOVE BIO SURGEON STRL SZ7.5 (GLOVE) ×3 IMPLANT
GLOVE BIO SURGEONS STRL SZ 6.5 (GLOVE) ×3
GLOVE BIOGEL PI IND STRL 6.5 (GLOVE) ×1 IMPLANT
GLOVE BIOGEL PI IND STRL 7.5 (GLOVE) ×1 IMPLANT
GLOVE BIOGEL PI IND STRL 8 (GLOVE) ×1 IMPLANT
GLOVE BIOGEL PI INDICATOR 6.5 (GLOVE) ×2
GLOVE BIOGEL PI INDICATOR 7.5 (GLOVE) ×2
GLOVE BIOGEL PI INDICATOR 8 (GLOVE) ×2
GOWN STRL REUS W/ TWL LRG LVL3 (GOWN DISPOSABLE) ×2 IMPLANT
GOWN STRL REUS W/ TWL XL LVL3 (GOWN DISPOSABLE) ×1 IMPLANT
GOWN STRL REUS W/TWL LRG LVL3 (GOWN DISPOSABLE) ×4
GOWN STRL REUS W/TWL XL LVL3 (GOWN DISPOSABLE) ×2
K-WIRE ACE 1.6X6 (WIRE) ×15
KIT BASIN OR (CUSTOM PROCEDURE TRAY) ×3 IMPLANT
KIT TURNOVER KIT B (KITS) ×3 IMPLANT
KWIRE ACE 1.6X6 (WIRE) ×5 IMPLANT
LOOP VESSEL MAXI BLUE (MISCELLANEOUS) ×3 IMPLANT
MANIFOLD NEPTUNE II (INSTRUMENTS) ×3 IMPLANT
NEEDLE HYPO 25X1 1.5 SAFETY (NEEDLE) ×3 IMPLANT
NS IRRIG 1000ML POUR BTL (IV SOLUTION) ×3 IMPLANT
PACK ORTHO EXTREMITY (CUSTOM PROCEDURE TRAY) ×3 IMPLANT
PAD ARMBOARD 7.5X6 YLW CONV (MISCELLANEOUS) ×6 IMPLANT
PAD CAST 3X4 CTTN HI CHSV (CAST SUPPLIES) ×1 IMPLANT
PAD CAST 4YDX4 CTTN HI CHSV (CAST SUPPLIES) ×1 IMPLANT
PADDING CAST COTTON 3X4 STRL (CAST SUPPLIES) ×2
PADDING CAST COTTON 4X4 STRL (CAST SUPPLIES) ×2
PLATE DIST HUM MEDIAL LT SM (Plate) ×3 IMPLANT
PLATE L OBLIQ 2.7X35 (Plate) ×1 IMPLANT
PLATE LOCK LT SM (Plate) ×3 IMPLANT
PLATE OLECRANON MED (Plate) ×3 IMPLANT
PLATE-L OBLIQ 2.7X35 (Plate) ×3 IMPLANT
SCREW CORT 2.5X12X2.7XST SM (Screw) ×1 IMPLANT
SCREW CORT 3.5X26 (Screw) ×4 IMPLANT
SCREW CORT T15 24X3.5XST LCK (Screw) ×1 IMPLANT
SCREW CORT T15 26X3.5XST LCK (Screw) ×2 IMPLANT
SCREW CORT T15 28X3.5XST LCK (Screw) ×1 IMPLANT
SCREW CORTICAL 2.7MM  24MM (Screw) ×2 IMPLANT
SCREW CORTICAL 2.7MM  26MM (Screw) ×2 IMPLANT
SCREW CORTICAL 2.7MM  28MM (Screw) ×2 IMPLANT
SCREW CORTICAL 2.7MM  65MM (Screw) ×2 IMPLANT
SCREW CORTICAL 2.7MM 24MM (Screw) ×1 IMPLANT
SCREW CORTICAL 2.7MM 26MM (Screw) ×1 IMPLANT
SCREW CORTICAL 2.7MM 28MM (Screw) ×1 IMPLANT
SCREW CORTICAL 2.7MM 55MM (Screw) ×3 IMPLANT
SCREW CORTICAL 2.7MM 65MM (Screw) ×1 IMPLANT
SCREW CORTICAL 2.7X12 (Screw) ×2 IMPLANT
SCREW CORTICAL 2.7X18MM (Screw) ×3 IMPLANT
SCREW CORTICAL 3.5MM  28MM (Screw) ×2 IMPLANT
SCREW CORTICAL 3.5MM  44MM (Screw) ×2 IMPLANT
SCREW CORTICAL 3.5MM 22MM (Screw) ×3 IMPLANT
SCREW CORTICAL 3.5MM 28MM (Screw) ×1 IMPLANT
SCREW CORTICAL 3.5MM 44MM (Screw) ×1 IMPLANT
SCREW CORTICAL 3.5X24MM (Screw) ×2 IMPLANT
SCREW CORTICAL 3.5X28MM (Screw) ×2 IMPLANT
SCREW CORTICAL LOCK 2.7X22 (Screw) ×3 IMPLANT
SCREW LOCK 3.5X42 DIST TIB (Screw) ×3 IMPLANT
SCREW LOCK 3.5X48 DIST TIB (Screw) ×3 IMPLANT
SCREW LOCK 3.5X50 DIST TIB (Screw) ×3 IMPLANT
SCREW LOCK CORT STAR 3.5X10 (Screw) ×3 IMPLANT
SCREW LOCK CORT STAR 3.5X12 (Screw) ×6 IMPLANT
SCREW LOCK CORT STAR 3.5X16 (Screw) ×9 IMPLANT
SCREW LOCK CORT STAR 3.5X20 (Screw) ×3 IMPLANT
SCREW LOCK CORT STAR 3.5X22 (Screw) ×3 IMPLANT
SCREW LOCK CORT STAR 3.5X46 (Screw) ×3 IMPLANT
SCREW LOCK MDS 3.5X44 (Screw) ×3 IMPLANT
SCREW LOW PROFILE 18MMX3.5MM (Screw) ×3 IMPLANT
SCREW NON LOCKING LP 3.5 14MM (Screw) ×3 IMPLANT
SPLINT PLASTER CAST XFAST 5X30 (CAST SUPPLIES) ×2 IMPLANT
SPLINT PLASTER XFAST SET 5X30 (CAST SUPPLIES) ×4
SPONGE LAP 18X18 RF (DISPOSABLE) IMPLANT
STAPLER VISISTAT 35W (STAPLE) ×3 IMPLANT
STOCKINETTE IMPERVIOUS 9X36 MD (GAUZE/BANDAGES/DRESSINGS) ×3 IMPLANT
SUCTION FRAZIER HANDLE 10FR (MISCELLANEOUS) ×2
SUCTION TUBE FRAZIER 10FR DISP (MISCELLANEOUS) ×1 IMPLANT
SUT ETHILON 3 0 PS 1 (SUTURE) ×15 IMPLANT
SUT PDS AB 0 CT1 27 (SUTURE) ×6 IMPLANT
SUT VIC AB 0 CT1 27 (SUTURE) ×4
SUT VIC AB 0 CT1 27XBRD ANBCTR (SUTURE) ×2 IMPLANT
SUT VIC AB 2-0 CT1 27 (SUTURE) ×4
SUT VIC AB 2-0 CT1 TAPERPNT 27 (SUTURE) ×2 IMPLANT
SYR 5ML LL (SYRINGE) IMPLANT
SYR CONTROL 10ML LL (SYRINGE) ×3 IMPLANT
TOWEL GREEN STERILE (TOWEL DISPOSABLE) ×9 IMPLANT
TOWEL GREEN STERILE FF (TOWEL DISPOSABLE) ×3 IMPLANT
TRAY FOLEY MTR SLVR 16FR STAT (SET/KITS/TRAYS/PACK) IMPLANT
WATER STERILE IRR 1000ML POUR (IV SOLUTION) ×3 IMPLANT
YANKAUER SUCT BULB TIP NO VENT (SUCTIONS) IMPLANT

## 2019-10-18 NOTE — ED Notes (Signed)
Dr. Donell Beers paged to make aware pt arrived to ED
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
## 2019-10-18 NOTE — ED Notes (Signed)
[facility] at bedside 
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
## 2019-10-18 NOTE — ED Notes (Signed)
Dr. Wilkie Aye and I spoke with pt's mom and dad per his request. Updated regading pt's condition.

## 2019-10-18 NOTE — ED Notes (Signed)
[facility] notified (Tammy) - patient ready for transport to [facility] ED 

## 2019-10-18 NOTE — ED Notes (Signed)
[employer] states that pt can have his phone back. Given to patient. All other belongings remain in brown paper bag for [facility].

## 2019-10-18 NOTE — Telephone Encounter (Signed)
Pt's bagged belongings given to [facility] [address] [place] [employer].

## 2019-10-18 NOTE — Transfer of Care (Signed)
Immediate Anesthesia Transfer of Care Note  Patient: Allen Diaz  Procedure(s) Performed: OPEN REDUCTION INTERNAL FIXATION (ORIF) DISTAL HUMERUS FRACTURE (Left Arm Upper)  Patient Location: PACU  Anesthesia Type:General  Level of Consciousness: drowsy and patient cooperative  Airway & Oxygen Therapy: Patient Spontanous Breathing and Patient connected to face mask oxygen  Post-op Assessment: Report given to RN and Post -op Vital signs reviewed and stable  Post vital signs: Reviewed and stable  Last Vitals:  Vitals Value Taken Time  BP 92/51 10/18/19 1705  Temp    Pulse 83 10/18/19 1707  Resp 16 10/18/19 1707  SpO2 100 % 10/18/19 1707  Vitals shown include unvalidated device data.  Last Pain:  Vitals:   10/18/19 0800  TempSrc: Oral  PainSc:          Complications: No complications documented.
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
## 2019-10-18 NOTE — Progress Notes (Signed)
SLP Cancellation Note  Patient Details Name: Allen Diaz MRN: [id number] DOB: Mar 29, 1995   Cancelled evaluation: Pt in OR. Will f/u for cognitive/language assessment.  Allen Lun [name], MA CCC/SLP Acute Rehabilitation Services Office number [phone] Pager [phone]                                                                                                    [name] [name] 10/18/2019, 11:35 AM

## 2019-10-18 NOTE — ED Notes (Signed)
[employer] sergent states that I am able to give pt's money and wallet to his parents. Pt did state he was ok with his parents having his wallet, and $33.00. 4 stud earrings also given to his mother.
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
## 2019-10-18 NOTE — Progress Notes (Signed)
PT Cancellation Note  Patient Details Name: Allen Diaz MRN: [id number] DOB: 02/24/1996   Cancelled Treatment:    Reason Eval/Treat Not Completed: Patient at procedure or test/unavailable, pt in surgery. Will check back tomorrow.   Lyanne Co, PT  Acute Rehab Services  Pager [phone] Office [phone]    Lawana Chambers Rosine Solecki 10/18/2019, 1:37 PM
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
## 2019-10-18 NOTE — ED Notes (Signed)
Parents at bedside
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
## 2019-10-18 NOTE — ED Notes (Signed)
Report given to [facility] 

## 2019-10-18 NOTE — ED Notes (Signed)
Pt's clothing placed in a brown paper bag. Cell phone and car keys placed in brown bag. Will be given to [facility] when they arrive.
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
## 2019-10-18 NOTE — ED Notes (Signed)
Department placed on lockdown.

## 2019-10-18 NOTE — Anesthesia Procedure Notes (Signed)
Procedure Name: Intubation Date/Time: 10/18/2019 12:13 PM Performed by: Myna Bright, CRNA Pre-anesthesia Checklist: Patient identified, Emergency Drugs available, Suction available and Patient being monitored Patient Re-evaluated:Patient Re-evaluated prior to induction Oxygen Delivery Method: Circle system utilized Preoxygenation: Pre-oxygenation with 100% oxygen Induction Type: IV induction Ventilation: Mask ventilation without difficulty Laryngoscope Size: Mac and 4 Grade View: Grade I Tube type: Oral Tube size: 7.5 mm Number of attempts: 1 Airway Equipment and Method: Stylet Placement Confirmation: ETT inserted through vocal cords under direct vision,  positive ETCO2 and breath sounds checked- equal and bilateral Secured at: 22 cm Tube secured with: Tape Dental Injury: Teeth and Oropharynx as per pre-operative assessment

## 2019-10-19 ENCOUNTER — Encounter (HOSPITAL_COMMUNITY): Payer: Self-pay | Admitting: Student

## 2019-10-19 ENCOUNTER — Inpatient Hospital Stay (HOSPITAL_COMMUNITY): Payer: Medicaid Other

## 2019-10-19 ENCOUNTER — Other Ambulatory Visit: Payer: Self-pay

## 2019-10-19 LAB — BASIC METABOLIC PANEL
Anion gap: 10 (ref 5–15)
BUN: 5 mg/dL — ABNORMAL LOW (ref 6–20)
CO2: 28 mmol/L (ref 22–32)
Calcium: 8.7 mg/dL — ABNORMAL LOW (ref 8.9–10.3)
Chloride: 101 mmol/L (ref 98–111)
Creatinine, Ser: 0.84 mg/dL (ref 0.61–1.24)
GFR calc Af Amer: >60 mL/min (ref >60)
GFR calc non Af Amer: >60 mL/min (ref >60)
Glucose, Bld: 103 mg/dL — ABNORMAL HIGH (ref 70–99)
Potassium: 3.8 mmol/L (ref 3.5–5.1)
Sodium: 139 mmol/L (ref 135–145)

## 2019-10-19 LAB — CBC
HCT: 30 % — ABNORMAL LOW (ref 39.0–52.0)
Hemoglobin: 10.4 g/dL — ABNORMAL LOW (ref 13.0–17.0)
MCH: 35.1 pg — ABNORMAL HIGH (ref 26.0–34.0)
MCHC: 34.7 g/dL (ref 30.0–36.0)
MCV: 101.4 fL — ABNORMAL HIGH (ref 80.0–100.0)
Platelets: 151 10*3/uL (ref 150–400)
RBC: 2.96 MIL/uL — ABNORMAL LOW (ref 4.22–5.81)
RDW: 12.1 % (ref 11.5–15.5)
WBC: 6.4 10*3/uL (ref 4.0–10.5)
nRBC: 0 % (ref 0.0–0.2)

## 2019-10-19 MED ORDER — LACTATED RINGERS IV BOLUS
1000.0000 mL | Freq: Once | INTRAVENOUS | Status: AC
  Administered 2019-10-19: 1000 mL via INTRAVENOUS

## 2019-10-19 MED ORDER — VITAMIN D 25 MCG (1000 UNIT) PO TABS
2000.0000 [IU] | ORAL_TABLET | Freq: Two times a day (BID) | ORAL | Status: DC
  Administered 2019-10-19 – 2019-10-20 (×3): 2000 [IU] via ORAL
  Filled 2019-10-19 (×3): qty 2

## 2019-10-19 MED ORDER — HYDROMORPHONE HCL 1 MG/ML IJ SOLN: 0.5000 mg | Freq: Three times a day (TID) | INTRAMUSCULAR | Status: DC | PRN

## 2019-10-19 MED ORDER — VITAMIN D (ERGOCALCIFEROL) 1.25 MG (50000 UNIT) PO CAPS
50000.0000 [IU] | ORAL_CAPSULE | ORAL | Status: DC
  Administered 2019-10-19: 50000 [IU] via ORAL
  Filled 2019-10-19 (×2): qty 1

## 2019-10-19 MED ORDER — ACETAMINOPHEN 500 MG PO TABS
1000.0000 mg | ORAL_TABLET | Freq: Four times a day (QID) | ORAL | Status: DC
  Administered 2019-10-19 – 2019-10-20 (×5): 1000 mg via ORAL
  Filled 2019-10-19 (×5): qty 2

## 2019-10-19 MED ORDER — KETOROLAC TROMETHAMINE 15 MG/ML IJ SOLN
30.0000 mg | Freq: Four times a day (QID) | INTRAMUSCULAR | Status: DC
  Administered 2019-10-19 – 2019-10-20 (×5): 30 mg via INTRAVENOUS
  Filled 2019-10-19 (×5): qty 2

## 2019-10-19 MED ORDER — METHOCARBAMOL 1000 MG/10ML IJ SOLN
1000.0000 mg | Freq: Three times a day (TID) | INTRAVENOUS | Status: DC
  Administered 2019-10-20: 1000 mg via INTRAVENOUS
  Filled 2019-10-19: qty 1000
  Filled 2019-10-19 (×4): qty 10

## 2019-10-19 MED ORDER — METHOCARBAMOL 500 MG PO TABS
1000.0000 mg | ORAL_TABLET | Freq: Three times a day (TID) | ORAL | Status: DC
  Administered 2019-10-19 (×2): 1000 mg via ORAL
  Filled 2019-10-19 (×2): qty 2

## 2019-10-19 MED FILL — Hydromorphone HCl Inj 2 MG/ML: INTRAMUSCULAR | Qty: 1 | Status: AC
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
## 2019-10-19 NOTE — Anesthesia Postprocedure Evaluation (Signed)
Anesthesia Post Note  Patient: Allen Diaz  Procedure(s) Performed: OPEN REDUCTION INTERNAL FIXATION (ORIF) DISTAL HUMERUS FRACTURE (Left Arm Upper)     Patient location during evaluation: PACU Anesthesia Type: General Level of consciousness: awake and alert, oriented and patient cooperative Pain management: pain level controlled Vital Signs Assessment: post-procedure vital signs reviewed and stable Respiratory status: spontaneous breathing, nonlabored ventilation and respiratory function stable Cardiovascular status: blood pressure returned to baseline and stable Postop Assessment: no apparent nausea or vomiting Anesthetic complications: no   No complications documented.  Last Vitals:  Vitals:   10/19/19 0400 10/19/19 0600  BP: 126/81 126/71  Pulse: (!) 109 85  Resp: 17 11  Temp: 37 C   SpO2: 99% 96%    Last Pain:  Vitals:   10/19/19 0600  TempSrc:   PainSc: 2                  [name]

## 2019-10-19 NOTE — Progress Notes (Signed)
SLP Cancellation Note  Patient Details Name: Allen Diaz MRN: [id number] DOB: April 28, 1995   Cancelled treatment:       Reason Eval/Treat Not Completed: Other (comment) Pt currently transferring units. Will f/u for cognitive evaluation.    Mahala Menghini., M.A. CCC-SLP Acute Rehabilitation Services Pager [phone] Office [phone]  10/19/2019, 1:48 PM
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
## 2019-10-19 NOTE — Care Management (Addendum)
Pt states he is  missing court date today in [place]. TOC Case Manager notified DA's office at [facility]; faxed excuse letter to DA at [phone].  Also faxed excuse letter to [facility] of [facility] at [phone].    Quintella Baton, RN, BSN  Trauma/Neuro ICU Case Manager [phone]
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
## 2019-10-19 NOTE — Discharge Instructions (Addendum)
Truitt Merle, MD [place] PA-C [facility] [address] [phone] Jani Files)   [phone] (fax)                                  POST-OPERATIVE INSTRUCTIONS   WEIGHT BEARING STATUS: Non-weightbearing left upper extremity  RANGE OF MOTION/ACTIVITY: Keep splint in place until follow-up, wiggle fingers as much as tolerated  WOUND CARE ? Please keep splint clean dry and intact until follow-up. If your splint gets wet for any reason please contact the office immediately.  Do not stick anything down your splint such as pencils, momey, hangers to try and scratch yourself.  If you feel itchy take Benadryl as prescribed on the bottle for itching ? You may shower on Post-Op Day #2.  ? You must keep splint dry during this process and may find that a plastic bag taped around the extremity or alternatively a towel based bath may be a better option.   ? If you get your splint wet or if it is damaged please contact our clinic.  EXERCISES ? Due to your splint being in place you will not be able to bear weight through your extremity.   Please use your sling until follow-up. ? Please continue to work on range of motion of your fingers and stretch these multiple times a day to prevent stiffness. ? Please continue to ambulate and do not stay sitting or lying for too long. Perform foot and wrist pumps to assist in circulation.  DVT/PE prophylaxis: Aspirin 325 mg twice daily x 30 days  DIET: As you were eating previously.  Can use over the counter stool softeners and bowel preparations, such as Miralax, to help with bowel movements.  Narcotics can be constipating.  Be sure to drink plenty of fluids  REGIONAL ANESTHESIA (NERVE BLOCKS)  The anesthesia team may have performed a nerve block for you if safe in the setting of your care.  This is a great tool used to minimize pain.  Typically the block may start wearing off overnight but the long acting medicine may last for  3-4 days.  The nerve block wearing off can be a challenging period but please utilize your as needed pain medications to try and manage this period.    ICE AND ELEVATE INJURED/OPERATIVE EXTREMITY  Using ice and elevating the injured extremity above your heart can help with swelling and pain control.  Icing in a pulsatile fashion, such as 20 minutes on and 20 minutes off, can be followed.    Do not place ice directly on skin. Make sure there is a barrier between to skin and the ice pack.    Using frozen items such as frozen peas works well as the conform nicely to the are that needs to be iced.  POST-OP MEDICATIONS- Multimodal approach to pain control  In general your pain will be controlled with a combination of substances.  Prescriptions unless otherwise discussed are electronically sent to your pharmacy.  This is a carefully made plan we use to minimize narcotic use.                     -   Acetaminophen - Non-narcotic pain medicine taken on a scheduled basis        -   Oxycodone - This is a strong narcotic, to be used only on an "as needed" basis for pain.                  -  Robaxin -  Muscle relaxer taken on an "as needed" basis for muscle spasms                  -   Gabapentin - Helps with nerve pain, such as burning or tingling feeling in the operative arm             STOP SMOKING OR USING NICOTINE PRODUCTS!!!!  As discussed nicotine severely impairs your body's ability to heal surgical and traumatic wounds but also impairs bone healing.  Wounds and bone heal by forming microscopic blood vessels (angiogenesis) and nicotine is a vasoconstrictor (essentially, shrinks blood vessels).  Therefore, if vasoconstriction occurs to these microscopic blood vessels they essentially disappear and are unable to deliver necessary nutrients to the healing tissue.  This is one modifiable factor that you can do to dramatically increase your chances of healing your injury.    (This means no smoking, no  nicotine gum, patches, etc)   FOLLOW-UP ? If you develop a Fever (>101.5), Redness or Drainage from the surgical incision site, please call our office to arrange for an evaluation. ? Please call the office to schedule a follow-up appointment for your incision check if you do not already have one, 7-10 days post-operatively.  CALL THE OFFICE WITH ANY QUESTIONS OR CONCERNS: [phone]   VISIT OUR WEBSITE FOR ADDITIONAL INFORMATION: [email or web address]    OTHER HELPFUL INFORMATION   If you had a block, it will wear off between 8-24 hrs postop typically.  This is period when your pain may go from nearly zero to the pain you would have had postop without the block.  This is an abrupt transition but nothing dangerous is happening.  You may take an extra dose of narcotic when this happens.   You may be more comfortable sleeping in a semi-seated position the first few nights following surgery.  Keep a pillow propped under the elbow and forearm for comfort.  If you have a recliner type of chair it might be beneficial.  If not that is fine too, but it would be helpful to sleep propped up with pillows behind your operated shoulder as well under your elbow and forearm.  This will reduce pulling on the suture lines.   When dressing, put your operative arm in the sleeve first.  When getting undressed, take your operative arm out last.  Loose fitting, button-down shirts are recommended.  Often in the first days after surgery you may be more comfortable keeping your operative arm under your shirt and not through the sleeve.   You may return to work/school in the next couple of days when you feel up to it.  Desk work and typing in the sling is fine.   We suggest you use the pain medication the first night prior to going to bed, in order to ease any pain when the anesthesia wears off. You should avoid taking pain medications on an empty stomach as it will make you nauseous.  You should wean off your  narcotic medicines as soon as you are able.  Most patients will be off or using minimal narcotics before their first postop appointment.    Do not drink alcoholic beverages or take illicit drugs when taking pain medications.   It is against the law to drive while taking narcotics.  In some states it is against the law to drive while your arm is in a sling.    Pain medication may make you constipated.  Below are a few  solutions to try in this order:  Decrease the amount of pain medication if you aren't having pain.  Drink lots of decaffeinated fluids.  Drink prune juice and/or each dried prunes   If the first 3 don't work start with additional solutions -   Take Colace - an over-the-counter stool softener -   Take Senokot - an over-the-counter laxative -   Take Miralax - a stronger over-the-counter laxative     Living With Traumatic Brain Injury Traumatic brain injury (TBI) is an injury to the brain that may be mild, moderate, or severe. Symptoms of any type of TBI can be long lasting (chronic). Depending on the area of the brain that is affected, a TBI can interfere with vision, memory, concentration, speech, balance, sense of touch, and sleep. TBI can also cause chronic symptoms like headache or dizziness. How to cope with lifestyle changes After a TBI, you may need to make changes to your lifestyle in order to recover as well as possible. How quickly and how fully you recover will depend on the severity of your injury. Your recovery plan may involve:  Working with specialists to develop a rehabilitation plan to help you return to your regular activities. Your health care team may include: ? Physical or occupational therapists. ? Speech and language pathologists. ? Mental health counselors. ? Physicians like your primary care physician or neurologist.  Taking time off work or school, depending on your injury.  Avoiding situations where there is a risk for another head injury,  such as football, hockey, soccer, basketball, martial arts, downhill snow sports, and horseback riding. Do not do these activities until your health care provider approves.  Resting. Rest helps the brain to heal. Make sure you: ? Get plenty of sleep at night. Avoid staying up late at night. ? Keep the same bedtime hours on weekends and weekdays. ? Rest during the day. Take daytime naps or rest breaks when you feel tired.  Avoiding extra stress on your eyes. You may need to set time limits when working on the computer, watching TV, and reading.  Finding ways to manage stress. This may include: ? Avoiding activities that cause stress. ? Deep breathing, yoga, or meditation. ? Listening to music or spending time outdoors.  Making lists, setting reminders, or using a day planner to help your memory.  Allowing yourself plenty of time to complete everyday tasks, such as grocery shopping, paying bills, and doing laundry.  Avoiding driving. Your ability to drive safely may be affected by your injury. ? Rely on family, friends, or a transportation service to help you get around and to appointments. ? Have a professional evaluation to check your driving ability. ? Access support services to help you return to driving. These may include training and adaptive equipment. Follow these instructions at home:  Take over-the-counter and prescription medicines only as told by your health care provider. Do not take aspirin or other anti-inflammatory medicines such as ibuprofen or naproxen unless approved by your health care provider.  Avoid large amounts of caffeine. Your body may be more sensitive to it after your injury.  Do not use any products that contain nicotine or tobacco, such as cigarettes, e-cigarettes, nicotine gum, and patches. If you need help quitting, ask your health care provider.  Do not use drugs.  Limit alcohol intake to no more than 1 drink per day for nonpregnant women and 2 drinks  per day for men. One drink equals 12 ounces of beer, 5 ounces  of wine, or 1 ounces of hard liquor.  Do not drive until cleared by your health care provider.  Keep all follow-up visits as told by your health care provider. This is important. Where to find support  Talk with your employer, co-workers, teachers, or school counselor about your injury. Work together to develop a plan for completing tasks while you recover.  Talk to others living with a TBI. Join a support group with other people who have experienced a TBI.  Let your friends and family members know what they can do to help. This might include helping at home or transportation to appointments.  If you are unable to continue working after your injury, talk to a Child psychotherapist about options to help you meet your financial needs.  Seek out additional resources if you are a Building services engineer or family member, such as: ? Development worker, international aid Injury Center: dvbic.dcoe.mil ? Department of [employer]: 509-519-4620 Questions to ask your health care provider:  How serious is my injury?  What is my rehabilitation plan?  What is my expected recovery?  When can I return to work or school?  When can I return to regular activities, including driving? Contact a health care provider if:  You have new or worsening: ? Dizziness. ? Headache. ? Anxiety or depression. ? Irritability. ? Confusion. ? Jerky movements that you cannot control (seizures). ? Extreme sensitivity to light or sound. ? Nausea or vomiting. Summary  Traumatic brain injury (TBI) is an injury to your brain that can interfere with vision, memory, concentration, speech, balance, sense of touch, and sleep. TBI can also cause chronic symptoms like headache or dizziness.  After a TBI you may need to make several changes to your lifestyle in order to recover as well as possible. How quickly and how fully you recover will  depend on the severity of your injury.  Talk to your family, friends, employer, co-workers, Architectural technologist, or school counselor about your injury. Work together to develop a plan for completing tasks while you recover. This information is not intended to replace advice given to you by your health care provider. Make sure you discuss any questions you have with your health care provider. Document Revised: 06/19/2018 Document Reviewed: 02/22/2016 Elsevier Patient Education  2020 [employer].

## 2019-10-20 ENCOUNTER — Inpatient Hospital Stay (HOSPITAL_COMMUNITY): Payer: Medicaid Other

## 2019-10-20 LAB — BASIC METABOLIC PANEL WITH GFR
Anion gap: 11 (ref 5–15)
BUN: 6 mg/dL (ref 6–20)
CO2: 26 mmol/L (ref 22–32)
Calcium: 8.7 mg/dL — ABNORMAL LOW (ref 8.9–10.3)
Chloride: 102 mmol/L (ref 98–111)
Creatinine, Ser: 0.75 mg/dL (ref 0.61–1.24)
GFR calc Af Amer: >60 mL/min
GFR calc non Af Amer: >60 mL/min
Glucose, Bld: 99 mg/dL (ref 70–99)
Potassium: 3.8 mmol/L (ref 3.5–5.1)
Sodium: 139 mmol/L (ref 135–145)

## 2019-10-20 LAB — CBC
HCT: 26.9 % — ABNORMAL LOW (ref 39.0–52.0)
Hemoglobin: 9.3 g/dL — ABNORMAL LOW (ref 13.0–17.0)
MCH: 35.5 pg — ABNORMAL HIGH (ref 26.0–34.0)
MCHC: 34.6 g/dL (ref 30.0–36.0)
MCV: 102.7 fL — ABNORMAL HIGH (ref 80.0–100.0)
Platelets: 144 10*3/uL — ABNORMAL LOW (ref 150–400)
RBC: 2.62 MIL/uL — ABNORMAL LOW (ref 4.22–5.81)
RDW: 12.1 % (ref 11.5–15.5)
WBC: 5.7 10*3/uL (ref 4.0–10.5)
nRBC: 0 % (ref 0.0–0.2)

## 2019-10-20 MED ORDER — POLYETHYLENE GLYCOL 3350 17 G PO PACK: 17.0000 g | PACK | Freq: Every day | ORAL | 0 refills | Status: AC | PRN

## 2019-10-20 MED ORDER — DOCUSATE SODIUM 100 MG PO CAPS: 100.0000 mg | ORAL_CAPSULE | Freq: Two times a day (BID) | ORAL | 0 refills | Status: AC

## 2019-10-20 MED ORDER — OXYCODONE HCL 10 MG PO TABS: 10.0000 mg | ORAL_TABLET | Freq: Four times a day (QID) | ORAL | 0 refills | Status: AC | PRN

## 2019-10-20 MED ORDER — VITAMIN D (ERGOCALCIFEROL) 1.25 MG (50000 UNIT) PO CAPS: 50000.0000 [IU] | ORAL_CAPSULE | ORAL | 0 refills | Status: AC

## 2019-10-20 MED ORDER — ASPIRIN EC 325 MG PO TBEC
325.0000 mg | DELAYED_RELEASE_TABLET | Freq: Two times a day (BID) | ORAL | 0 refills | Status: AC
Start: 2019-10-20 — End: 2019-11-19

## 2019-10-20 MED ORDER — ACETAMINOPHEN 500 MG PO TABS: 1000.0000 mg | ORAL_TABLET | Freq: Four times a day (QID) | ORAL | 0 refills | Status: AC | PRN

## 2019-10-20 MED ORDER — LEVETIRACETAM 500 MG PO TABS
500.0000 mg | ORAL_TABLET | Freq: Two times a day (BID) | ORAL | 0 refills | Status: AC
Start: 2019-10-20 — End: 2019-10-26

## 2019-10-20 MED ORDER — METHOCARBAMOL 500 MG PO TABS: 1000.0000 mg | ORAL_TABLET | Freq: Three times a day (TID) | ORAL | 0 refills | Status: AC | PRN
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
## 2019-10-20 NOTE — Progress Notes (Addendum)
Brief progress note Trauma Surgery   NAEO. Pain controlled - denies any pain currently. Voiding without issue. +flatus. No BM.  Denies trouble sleeping, nightmares, re-living traumatic event   Exam: alert, NAD MSK: LUE splinted, fingers WWP, some ecchymosis of medial upper arm - no cellulitis or induration CV: RRR, pedal pulses 2+ Pulm: normal effort OA, CTAB  Psych: flat affect, A&Ox3   Head CT stable Ortho recommending ASA 325 BID at D/C for LUE DVT ppx  Will confirm ASA with trauma MD and neurosurgery, No documentation in chart from NS thus far. D/C patient home today with neurosurgery and orthopedic surgery follow up.    Hosie Spangle, PA-C

## 2019-10-20 NOTE — Progress Notes (Signed)
Pt discharged to home. 2 PIVs removed from rt FA, pt dressed and has belongings ready to go. Pt verbalized understanding of discharge information and has no further questions. Pt escorted to personal vehicle in wheelchair.   Robina Ade, RN

## 2019-10-24 ENCOUNTER — Emergency Department (HOSPITAL_BASED_OUTPATIENT_CLINIC_OR_DEPARTMENT_OTHER)
Admission: EM | Admit: 2019-10-24 | Discharge: 2019-10-24 | Disposition: A | Discharge status: Home / Self Care | Payer: Medicaid Other | Attending: Emergency Medicine | Admitting: Emergency Medicine

## 2019-10-24 ENCOUNTER — Other Ambulatory Visit: Payer: Self-pay

## 2019-10-24 ENCOUNTER — Encounter (HOSPITAL_BASED_OUTPATIENT_CLINIC_OR_DEPARTMENT_OTHER): Payer: Self-pay | Admitting: Emergency Medicine

## 2019-10-24 DIAGNOSIS — M79602 Pain in left arm: Secondary | ICD-10-CM | POA: Diagnosis not present

## 2019-10-24 DIAGNOSIS — F17290 Nicotine dependence, other tobacco product, uncomplicated: Secondary | ICD-10-CM | POA: Diagnosis not present

## 2019-10-24 HISTORY — DX: Accidental discharge from unspecified firearms or gun, initial encounter: W34.00XA

## 2019-10-24 MED ORDER — OXYCODONE-ACETAMINOPHEN 5-325 MG PO TABS: 1.0000 | ORAL_TABLET | Freq: Four times a day (QID) | ORAL | 0 refills | Status: DC | PRN
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
## 2019-10-24 NOTE — Discharge Instructions (Signed)
Please keep your appointment with the orthopedic surgery team.  Return to the emergency department any new or suddenly worsening symptoms.

## 2019-11-03 ENCOUNTER — Encounter: Payer: Self-pay | Admitting: Physical Therapy

## 2019-11-03 ENCOUNTER — Ambulatory Visit: Payer: Medicaid Other | Attending: Student | Admitting: Physical Therapy

## 2019-11-03 ENCOUNTER — Other Ambulatory Visit: Payer: Self-pay

## 2019-11-03 DIAGNOSIS — M6281 Muscle weakness (generalized): Secondary | ICD-10-CM | POA: Diagnosis present

## 2019-11-03 DIAGNOSIS — M25522 Pain in left elbow: Secondary | ICD-10-CM | POA: Insufficient documentation

## 2019-11-03 DIAGNOSIS — M25622 Stiffness of left elbow, not elsewhere classified: Secondary | ICD-10-CM | POA: Insufficient documentation

## 2019-11-03 NOTE — Patient Instructions (Signed)
Access Code: [email or web address] URL: [email or web address] Date: 11/03/2019 Prepared by: Lysle Rubens  Exercises Seated Elbow Flexion and Extension AROM - 1 x daily - 7 x weekly - 3 sets - 10 reps Supine Elbow Extension Stretch in Pronation - 1 x daily - 7 x weekly - 3 sets - 1 reps - 3-5 min hold Elbow Extension PROM - 1 x daily - 7 x weekly - 3 sets - 3 reps - 30 sec hold Elbow Flexion PROM - 1 x daily - 7 x weekly - 3 sets - 3 reps - 30 sec hold Seated Wrist Extension with Dumbbell - 1 x daily - 7 x weekly - 3 sets - 10 reps Seated Wrist Flexion with Dumbbell - 1 x daily - 7 x weekly - 3 sets - 10 reps Seated Forearm Pronation and Supination AROM - 1 x daily - 7 x weekly - 3 sets - 10 reps
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
## 2019-11-10 ENCOUNTER — Ambulatory Visit: Payer: Medicaid Other | Attending: Student | Admitting: Physical Therapy

## 2019-11-10 DIAGNOSIS — M6281 Muscle weakness (generalized): Secondary | ICD-10-CM | POA: Insufficient documentation

## 2019-11-10 DIAGNOSIS — M25622 Stiffness of left elbow, not elsewhere classified: Secondary | ICD-10-CM | POA: Insufficient documentation

## 2019-11-10 DIAGNOSIS — M25522 Pain in left elbow: Secondary | ICD-10-CM | POA: Insufficient documentation

## 2019-11-11 ENCOUNTER — Encounter: Payer: Self-pay | Admitting: Physical Therapy

## 2019-11-11 ENCOUNTER — Ambulatory Visit (HOSPITAL_BASED_OUTPATIENT_CLINIC_OR_DEPARTMENT_OTHER): Payer: Medicaid Other | Admitting: Family

## 2019-11-11 ENCOUNTER — Other Ambulatory Visit: Payer: Self-pay

## 2019-11-11 ENCOUNTER — Ambulatory Visit: Payer: Medicaid Other | Admitting: Physical Therapy

## 2019-11-11 DIAGNOSIS — M6281 Muscle weakness (generalized): Secondary | ICD-10-CM | POA: Diagnosis present

## 2019-11-11 DIAGNOSIS — Z5329 Procedure and treatment not carried out because of patient's decision for other reasons: Secondary | ICD-10-CM

## 2019-11-11 DIAGNOSIS — M25622 Stiffness of left elbow, not elsewhere classified: Secondary | ICD-10-CM | POA: Diagnosis not present

## 2019-11-11 DIAGNOSIS — M25522 Pain in left elbow: Secondary | ICD-10-CM
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
## 2019-11-11 NOTE — Progress Notes (Signed)
Patient did not show for appointment.   

## 2019-11-16 ENCOUNTER — Other Ambulatory Visit: Payer: Self-pay

## 2019-11-16 ENCOUNTER — Encounter: Payer: Self-pay | Admitting: Physical Therapy

## 2019-11-16 ENCOUNTER — Ambulatory Visit: Payer: Medicaid Other | Admitting: Physical Therapy

## 2019-11-16 DIAGNOSIS — M6281 Muscle weakness (generalized): Secondary | ICD-10-CM

## 2019-11-16 DIAGNOSIS — M25522 Pain in left elbow: Secondary | ICD-10-CM

## 2019-11-16 DIAGNOSIS — M25622 Stiffness of left elbow, not elsewhere classified: Secondary | ICD-10-CM | POA: Diagnosis not present
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
## 2019-11-17 ENCOUNTER — Ambulatory Visit: Payer: Medicaid Other | Admitting: Physical Therapy

## 2019-11-18 ENCOUNTER — Encounter: Payer: Self-pay | Admitting: Physical Therapy

## 2019-11-18 ENCOUNTER — Ambulatory Visit: Payer: Medicaid Other | Admitting: Physical Therapy

## 2019-11-18 ENCOUNTER — Other Ambulatory Visit: Payer: Self-pay

## 2019-11-18 DIAGNOSIS — M25622 Stiffness of left elbow, not elsewhere classified: Secondary | ICD-10-CM | POA: Diagnosis not present

## 2019-11-18 DIAGNOSIS — M25522 Pain in left elbow: Secondary | ICD-10-CM

## 2019-11-18 DIAGNOSIS — M6281 Muscle weakness (generalized): Secondary | ICD-10-CM
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
## 2019-11-22 ENCOUNTER — Encounter: Payer: Medicaid Other | Admitting: Physical Therapy

## 2019-11-23 ENCOUNTER — Ambulatory Visit: Payer: Medicaid Other | Admitting: Physical Therapy

## 2019-11-23 ENCOUNTER — Encounter: Payer: Self-pay | Admitting: Physical Therapy

## 2019-11-23 ENCOUNTER — Other Ambulatory Visit: Payer: Self-pay

## 2019-11-23 DIAGNOSIS — M25622 Stiffness of left elbow, not elsewhere classified: Secondary | ICD-10-CM

## 2019-11-23 DIAGNOSIS — M6281 Muscle weakness (generalized): Secondary | ICD-10-CM

## 2019-11-23 DIAGNOSIS — M25522 Pain in left elbow: Secondary | ICD-10-CM
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
## 2019-11-25 ENCOUNTER — Ambulatory Visit: Payer: Medicaid Other | Admitting: Physical Therapy

## 2019-11-29 ENCOUNTER — Encounter: Payer: Medicaid Other | Admitting: Physical Therapy

## 2019-12-01 ENCOUNTER — Ambulatory Visit: Payer: Medicaid Other | Admitting: Physical Therapy

## 2019-12-11 ENCOUNTER — Emergency Department (HOSPITAL_BASED_OUTPATIENT_CLINIC_OR_DEPARTMENT_OTHER)
Admission: EM | Admit: 2019-12-11 | Discharge: 2019-12-11 | Disposition: A | Discharge status: Home / Self Care | Payer: Medicaid Other | Attending: Emergency Medicine | Admitting: Emergency Medicine

## 2019-12-11 ENCOUNTER — Other Ambulatory Visit: Payer: Self-pay

## 2019-12-11 DIAGNOSIS — F17290 Nicotine dependence, other tobacco product, uncomplicated: Secondary | ICD-10-CM | POA: Insufficient documentation

## 2019-12-11 DIAGNOSIS — R369 Urethral discharge, unspecified: Secondary | ICD-10-CM | POA: Diagnosis present

## 2019-12-11 DIAGNOSIS — R309 Painful micturition, unspecified: Secondary | ICD-10-CM | POA: Diagnosis not present

## 2019-12-11 DIAGNOSIS — Z202 Contact with and (suspected) exposure to infections with a predominantly sexual mode of transmission: Secondary | ICD-10-CM

## 2019-12-11 LAB — URINALYSIS, ROUTINE W REFLEX MICROSCOPIC
Bilirubin Urine: NEGATIVE
Glucose, UA: NEGATIVE mg/dL
Ketones, ur: NEGATIVE mg/dL
Nitrite: NEGATIVE
Protein, ur: 30 mg/dL — AB
Specific Gravity, Urine: >1.03 — ABNORMAL HIGH (ref 1.005–1.030)
pH: 6 (ref 5.0–8.0)

## 2019-12-11 LAB — URINALYSIS, MICROSCOPIC (REFLEX): WBC, UA: >50 WBC/hpf (ref 0–5)

## 2019-12-11 MED ORDER — DOXYCYCLINE HYCLATE 100 MG PO CAPS: 100.0000 mg | ORAL_CAPSULE | Freq: Two times a day (BID) | ORAL | 0 refills | Status: AC

## 2019-12-11 MED ORDER — CEFTRIAXONE SODIUM 500 MG IJ SOLR
500.0000 mg | Freq: Once | INTRAMUSCULAR | Status: AC
  Administered 2019-12-11: 500 mg via INTRAMUSCULAR
  Filled 2019-12-11: qty 500

## 2019-12-11 MED ORDER — LIDOCAINE HCL (PF) 1 % IJ SOLN
INTRAMUSCULAR | Status: AC
  Administered 2019-12-11: 1 mL
  Filled 2019-12-11: qty 5

## 2019-12-11 MED ORDER — DOXYCYCLINE HYCLATE 100 MG PO TABS
100.0000 mg | ORAL_TABLET | Freq: Once | ORAL | Status: AC
  Administered 2019-12-11: 100 mg via ORAL
  Filled 2019-12-11: qty 1

## 2019-12-11 NOTE — Discharge Instructions (Signed)
Take the medication as prescribed.  Return for new or worsening symptoms. 
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
## 2019-12-12 LAB — RPR: RPR Ser Ql: NONREACTIVE

## 2019-12-12 LAB — HIV ANTIBODY (ROUTINE TESTING W REFLEX): HIV Screen 4th Generation wRfx: NONREACTIVE

## 2019-12-13 LAB — GC/CHLAMYDIA PROBE AMP (~~LOC~~) NOT AT ARMC
Chlamydia: NEGATIVE
Comment: NEGATIVE
Comment: NORMAL
Neisseria Gonorrhea: POSITIVE — AB

## 2020-02-23 ENCOUNTER — Encounter (HOSPITAL_BASED_OUTPATIENT_CLINIC_OR_DEPARTMENT_OTHER): Payer: Self-pay

## 2020-02-23 ENCOUNTER — Other Ambulatory Visit: Payer: Self-pay

## 2020-02-23 ENCOUNTER — Emergency Department (HOSPITAL_BASED_OUTPATIENT_CLINIC_OR_DEPARTMENT_OTHER)
Admission: EM | Admit: 2020-02-23 | Discharge: 2020-02-23 | Disposition: A | Discharge status: Home / Self Care | Payer: Medicaid Other | Attending: Emergency Medicine | Admitting: Emergency Medicine

## 2020-02-23 DIAGNOSIS — F17290 Nicotine dependence, other tobacco product, uncomplicated: Secondary | ICD-10-CM | POA: Diagnosis not present

## 2020-02-23 DIAGNOSIS — Z20822 Contact with and (suspected) exposure to covid-19: Secondary | ICD-10-CM | POA: Diagnosis not present

## 2020-02-23 DIAGNOSIS — R070 Pain in throat: Secondary | ICD-10-CM | POA: Diagnosis present

## 2020-02-23 DIAGNOSIS — J029 Acute pharyngitis, unspecified: Secondary | ICD-10-CM | POA: Diagnosis not present

## 2020-02-23 LAB — GROUP A STREP BY PCR: Group A Strep by PCR: NOT DETECTED

## 2020-02-23 LAB — RESP PANEL BY RT-PCR (FLU A&B, COVID) ARPGX2
Influenza A by PCR: NEGATIVE
Influenza B by PCR: NEGATIVE
SARS Coronavirus 2 by RT PCR: NEGATIVE

## 2020-02-23 MED ORDER — DEXAMETHASONE 6 MG PO TABS
10.0000 mg | ORAL_TABLET | Freq: Once | ORAL | Status: AC
  Administered 2020-02-23: 09:00:00 10 mg via ORAL
  Filled 2020-02-23: qty 1

## 2020-02-23 NOTE — ED Triage Notes (Signed)
Pt reports sore throat that started this morning.  
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
## 2020-12-24 ENCOUNTER — Other Ambulatory Visit: Payer: Self-pay

## 2020-12-24 ENCOUNTER — Emergency Department (HOSPITAL_BASED_OUTPATIENT_CLINIC_OR_DEPARTMENT_OTHER)
Admission: EM | Admit: 2020-12-24 | Discharge: 2020-12-25 | Disposition: A | Discharge status: Home / Self Care | Payer: Medicaid Other | Attending: Emergency Medicine | Admitting: Emergency Medicine

## 2020-12-24 ENCOUNTER — Encounter (HOSPITAL_BASED_OUTPATIENT_CLINIC_OR_DEPARTMENT_OTHER): Payer: Self-pay | Admitting: *Deleted

## 2020-12-24 DIAGNOSIS — T22112A Burn of first degree of left forearm, initial encounter: Secondary | ICD-10-CM | POA: Insufficient documentation

## 2020-12-24 DIAGNOSIS — T22112S Burn of first degree of left forearm, sequela: Secondary | ICD-10-CM | POA: Diagnosis present

## 2020-12-24 DIAGNOSIS — X17XXXA Contact with hot engines, machinery and tools, initial encounter: Secondary | ICD-10-CM | POA: Insufficient documentation

## 2020-12-24 DIAGNOSIS — X17XXXS Contact with hot engines, machinery and tools, sequela: Secondary | ICD-10-CM | POA: Diagnosis not present

## 2020-12-24 DIAGNOSIS — Y92410 Unspecified street and highway as the place of occurrence of the external cause: Secondary | ICD-10-CM | POA: Diagnosis not present

## 2020-12-24 DIAGNOSIS — Z4801 Encounter for change or removal of surgical wound dressing: Secondary | ICD-10-CM | POA: Diagnosis not present

## 2020-12-24 DIAGNOSIS — F17290 Nicotine dependence, other tobacco product, uncomplicated: Secondary | ICD-10-CM | POA: Insufficient documentation

## 2020-12-24 DIAGNOSIS — L089 Local infection of the skin and subcutaneous tissue, unspecified: Secondary | ICD-10-CM | POA: Diagnosis not present

## 2020-12-24 DIAGNOSIS — Z5189 Encounter for other specified aftercare: Secondary | ICD-10-CM

## 2020-12-24 DIAGNOSIS — T148XXA Other injury of unspecified body region, initial encounter: Secondary | ICD-10-CM | POA: Insufficient documentation
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
## 2020-12-25 MED ORDER — DOXYCYCLINE HYCLATE 100 MG PO CAPS: 100.0000 mg | ORAL_CAPSULE | Freq: Two times a day (BID) | ORAL | 0 refills | Status: DC

## 2020-12-25 MED ORDER — DOXYCYCLINE HYCLATE 100 MG PO TABS
100.0000 mg | ORAL_TABLET | Freq: Once | ORAL | Status: AC
  Administered 2020-12-25: 100 mg via ORAL
  Filled 2020-12-25: qty 1
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
## 2020-12-30 ENCOUNTER — Other Ambulatory Visit: Payer: Self-pay

## 2020-12-30 ENCOUNTER — Encounter (HOSPITAL_BASED_OUTPATIENT_CLINIC_OR_DEPARTMENT_OTHER): Payer: Self-pay | Admitting: Emergency Medicine

## 2020-12-30 ENCOUNTER — Emergency Department (HOSPITAL_BASED_OUTPATIENT_CLINIC_OR_DEPARTMENT_OTHER)
Admission: EM | Admit: 2020-12-30 | Discharge: 2020-12-30 | Disposition: A | Discharge status: Home / Self Care | Payer: Medicaid Other | Attending: Emergency Medicine | Admitting: Emergency Medicine

## 2020-12-30 ENCOUNTER — Emergency Department (HOSPITAL_BASED_OUTPATIENT_CLINIC_OR_DEPARTMENT_OTHER): Payer: Medicaid Other

## 2020-12-30 DIAGNOSIS — R2231 Localized swelling, mass and lump, right upper limb: Secondary | ICD-10-CM | POA: Insufficient documentation

## 2020-12-30 DIAGNOSIS — F17290 Nicotine dependence, other tobacco product, uncomplicated: Secondary | ICD-10-CM | POA: Insufficient documentation

## 2020-12-30 DIAGNOSIS — M25531 Pain in right wrist: Secondary | ICD-10-CM | POA: Diagnosis not present

## 2020-12-30 DIAGNOSIS — M79641 Pain in right hand: Secondary | ICD-10-CM | POA: Insufficient documentation

## 2020-12-30 MED ORDER — NAPROXEN 500 MG PO TABS: 500.0000 mg | ORAL_TABLET | Freq: Two times a day (BID) | ORAL | 0 refills | Status: AC | PRN

## 2020-12-30 MED ORDER — DICLOFENAC SODIUM 1 % EX GEL: 2.0000 g | Freq: Four times a day (QID) | CUTANEOUS | 0 refills | Status: AC | PRN
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
## 2020-12-30 NOTE — ED Triage Notes (Signed)
Pt on cell phone. Refused to end call for triage.

## 2020-12-30 NOTE — Discharge Instructions (Signed)
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
## 2020-12-30 NOTE — ED Notes (Signed)
ED Provider at bedside. 

## 2021-01-03 ENCOUNTER — Ambulatory Visit: Payer: Medicaid Other | Admitting: Family Medicine
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
## 2021-03-16 ENCOUNTER — Emergency Department (HOSPITAL_BASED_OUTPATIENT_CLINIC_OR_DEPARTMENT_OTHER): Payer: Medicaid Other

## 2021-03-16 ENCOUNTER — Encounter (HOSPITAL_BASED_OUTPATIENT_CLINIC_OR_DEPARTMENT_OTHER): Payer: Self-pay | Admitting: Emergency Medicine

## 2021-03-16 ENCOUNTER — Emergency Department (HOSPITAL_BASED_OUTPATIENT_CLINIC_OR_DEPARTMENT_OTHER)
Admission: EM | Admit: 2021-03-16 | Discharge: 2021-03-16 | Disposition: A | Discharge status: Home / Self Care | Payer: Medicaid Other | Attending: Emergency Medicine | Admitting: Emergency Medicine

## 2021-03-16 ENCOUNTER — Other Ambulatory Visit: Payer: Self-pay

## 2021-03-16 DIAGNOSIS — M25432 Effusion, left wrist: Secondary | ICD-10-CM | POA: Diagnosis present

## 2021-03-16 DIAGNOSIS — M71322 Other bursal cyst, left elbow: Secondary | ICD-10-CM | POA: Insufficient documentation

## 2021-03-16 NOTE — ED Provider Notes (Signed)
[facility] EMERGENCY DEPARTMENT Provider Note   CSN: [id number] Arrival date & time: 03/16/21  0015     History  Chief Complaint  Patient presents with   Arm Swelling   finger swelling    Allen Diaz is a 26 y.o. male.  The history is provided by the patient.  He has a history of seizures and comes in because of swelling of his left elbow which started this afternoon.  He denies any trauma.  There is pain with movement, but no pain if he holds it still.  He denies any numbness or tingling.   Home Medications Prior to Admission medications   Medication Sig Start Date End Date Taking? Authorizing Provider  acetaminophen (TYLENOL) 500 MG tablet Take 2 tablets (1,000 mg total) by mouth every 6 (six) hours as needed for mild pain or moderate pain. 10/20/19   Adam Phenix, PA-C  diclofenac Sodium (VOLTAREN) 1 % GEL Apply 2 g topically 4 (four) times daily as needed. 12/30/20   Long, Arlyss Repress, MD  docusate sodium (COLACE) 100 MG capsule Take 1 capsule (100 mg total) by mouth 2 (two) times daily. 10/20/19   Adam Phenix, PA-C  doxycycline (VIBRAMYCIN) 100 MG capsule Take 1 capsule (100 mg total) by mouth 2 (two) times daily. One po bid x 7 days 12/25/20   Molpus, Jonny Ruiz, MD  levETIRAcetam (KEPPRA) 500 MG tablet Take 1 tablet (500 mg total) by mouth 2 (two) times daily for 6 days. 10/20/19 10/26/19  Adam Phenix, PA-C  methocarbamol (ROBAXIN) 500 MG tablet Take 2 tablets (1,000 mg total) by mouth every 8 (eight) hours as needed for muscle spasms (pain). 10/20/19   Adam Phenix, PA-C  naproxen (NAPROSYN) 500 MG tablet Take 1 tablet (500 mg total) by mouth 2 (two) times daily as needed for moderate pain. 12/30/20   Long, Arlyss Repress, MD  oxyCODONE-acetaminophen (PERCOCET/ROXICET) 5-325 MG tablet Take 1 tablet by mouth every 6 (six) hours as needed for severe pain. 10/24/19   Long, Arlyss Repress, MD  polyethylene glycol (MIRALAX / GLYCOLAX) 17 g packet Take 17 g by  mouth daily as needed for mild constipation. 10/20/19   Adam Phenix, PA-C  Vitamin D, Ergocalciferol, (DRISDOL) 1.25 MG (50000 UNIT) CAPS capsule Take 1 capsule (50,000 Units total) by mouth every 7 (seven) days. 10/26/19   Adam Phenix, PA-C      Allergies    Patient has no known allergies.    Review of Systems   Review of Systems  All other systems reviewed and are negative.  Physical Exam Updated Vital Signs BP 136/74 (BP Location: Right Arm)    Pulse 94    Temp 98.2 F (36.8 C) (Oral)    Resp 20    Ht 5\' 11"  (1.803 m)    Wt 65.8 kg    SpO2 92%    BMI 20.22 kg/m  Physical Exam Vitals and nursing note reviewed.  26 year old male, resting comfortably and in no acute distress. Vital signs are normal. Oxygen saturation is 92%, which is normal. Head is normocephalic and atraumatic. PERRLA, EOMI. Oropharynx is clear. Neck is nontender and supple without adenopathy or JVD. Back is nontender and there is no CVA tenderness. Lungs are clear without rales, wheezes, or rhonchi. Chest is nontender. Heart has regular rate and rhythm without murmur. Abdomen is soft, flat, nontender. Extremities: There is a subcutaneous cystic structure of the posterior aspect of the left elbow lateral to the  olecranon measuring 3 cm x 3 cm.  This is tender to palpation and does restrict movement.  He is unable to fully extend the elbow.  Distal neurovascular exam is intact with strong pulses, prompt capillary refill, normal sensation.  There is no erythema or warmth, no swelling of the olecranon bursa. Skin is warm and dry without rash. Neurologic: Mental status is normal, cranial nerves are intact, moves all extremities equally.  ED Results / Procedures / Treatments    Radiology DG Elbow Complete Left  Result Date: 03/16/2021 CLINICAL DATA:  Left arm swelling. EXAM: LEFT ELBOW - COMPLETE 3+ VIEW COMPARISON:  October 18, 2019 FINDINGS: Extensive postoperative changes are seen, with radiopaque  fixation plates and screws noted along the medial and lateral aspects of the distal left humerus and posterior aspect of the proximal left ulna. A chronic fracture deformity of the left radial head is seen. No acute fracture or dislocation is identified. Tiny radiopaque shrapnel fragments are seen within the soft tissues adjacent to the proximal left ulna. Mild lateral soft tissue swelling is also seen. IMPRESSION: 1. Extensive postoperative changes, as described above. 2. No acute osseous abnormality. 3. Mild lateral soft tissue swelling. Electronically Signed   By: Aram Candela M.D.   On: 03/16/2021 01:16    Procedures Ultrasound ED Soft Tissue  Date/Time: 03/16/2021 1:00 AM Performed by: Dione Booze, MD Authorized by: Dione Booze, MD   Procedure details:    Indications comment:  Localized swelling   Transverse view:  Visualized   Longitudinal view:  Visualized   Images: archived   Location:    Location: upper extremity     Side:  Left Findings:     no abscess present    no cellulitis present    no foreign body present Comments:     Cystic structure that does not have a water density at it's core    Medications Ordered in ED Medications - No data to display  ED Course/ Medical Decision Making/ A&P                           Medical Decision Making  Cystic subcutaneous structure of the left elbow.  Limited bedside ultrasound is done and it does not appear to be an abscess or simple cyst.  Will check x-rays, but will need orthopedic referral.  X-rays show postoperative changes with no acute process.  I have reviewed the images personally and agree with the radiologist interpretation.  Old records were reviewed showing open reduction internal fixation of left elbow on 10/18/2019.  Postoperative rehab visits are noted with statement about reduced range of motion of left elbow.  I have talked with the patient about this, and his elbow range of motion today is at his baseline.  He is  referred back to his orthopedic physician for evaluation of the cyst, consideration for aspiration or excision.  Patient is advised to apply ice and use over-the-counter analgesics as needed for pain.        Final Clinical Impression(s) / ED Diagnoses Final diagnoses:  Other bursal cyst, left elbow    Rx / DC Orders ED Discharge Orders     None         Dione Booze, MD 03/16/21 [phone]

## 2021-03-16 NOTE — ED Triage Notes (Signed)
Pt states left arm has a "Knot" on it noted local swelling on left mid arm next to elbow. And swelling to right side third finger.

## 2021-03-16 NOTE — Discharge Instructions (Addendum)
Apply ice for 30 minutes at a time, 4 times a day.  You may take ibuprofen or acetaminophen as needed for pain.  Please follow-up with one of the orthopedic physicians for further evaluation and treatment.  Return if swelling increases, you start running a fever, or noticed some redness of the overlying skin.

## 2021-03-16 NOTE — ED Notes (Signed)
Report given to Timmie Foerster RN

## 2021-03-25 ENCOUNTER — Emergency Department (HOSPITAL_BASED_OUTPATIENT_CLINIC_OR_DEPARTMENT_OTHER): Payer: Medicaid Other

## 2021-03-25 ENCOUNTER — Other Ambulatory Visit: Payer: Self-pay

## 2021-03-25 ENCOUNTER — Emergency Department (HOSPITAL_BASED_OUTPATIENT_CLINIC_OR_DEPARTMENT_OTHER)
Admission: EM | Admit: 2021-03-25 | Discharge: 2021-03-25 | Disposition: A | Discharge status: Home / Self Care | Payer: Medicaid Other | Attending: Emergency Medicine | Admitting: Emergency Medicine

## 2021-03-25 DIAGNOSIS — T85698A Other mechanical complication of other specified internal prosthetic devices, implants and grafts, initial encounter: Secondary | ICD-10-CM | POA: Insufficient documentation

## 2021-03-25 DIAGNOSIS — Z20822 Contact with and (suspected) exposure to covid-19: Secondary | ICD-10-CM | POA: Diagnosis not present

## 2021-03-25 DIAGNOSIS — Z1811 Retained magnetic metal fragments: Secondary | ICD-10-CM | POA: Diagnosis not present

## 2021-03-25 DIAGNOSIS — Z79899 Other long term (current) drug therapy: Secondary | ICD-10-CM | POA: Diagnosis not present

## 2021-03-25 DIAGNOSIS — T84498A Other mechanical complication of other internal orthopedic devices, implants and grafts, initial encounter: Secondary | ICD-10-CM

## 2021-03-25 DIAGNOSIS — M71322 Other bursal cyst, left elbow: Secondary | ICD-10-CM | POA: Diagnosis present

## 2021-03-25 LAB — RESP PANEL BY RT-PCR (FLU A&B, COVID) ARPGX2
Influenza A by PCR: NEGATIVE
Influenza B by PCR: NEGATIVE
SARS Coronavirus 2 by RT PCR: NEGATIVE

## 2021-03-25 MED ORDER — CEPHALEXIN 500 MG PO CAPS: 1000.0000 mg | ORAL_CAPSULE | Freq: Two times a day (BID) | ORAL | 0 refills | Status: AC

## 2021-03-25 MED ORDER — CEFAZOLIN SODIUM-DEXTROSE 1-4 GM/50ML-% IV SOLN
1.0000 g | Freq: Once | INTRAVENOUS | Status: AC
  Administered 2021-03-25: 1 g via INTRAVENOUS
  Filled 2021-03-25: qty 50
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
## 2021-03-25 NOTE — Discharge Instructions (Signed)
Keep the wounds clean and dry.  Follow-up with your orthopedic surgeon in the office.  Please return for redness drainage or if you develop a fever.
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
## 2022-06-24 ENCOUNTER — Encounter: Payer: Self-pay | Admitting: *Deleted
